# Patient Record
Sex: Female | Born: 1955 | Race: White | Hispanic: No | Marital: Married | Smoking: Never smoker
Health system: Southern US, Community
[De-identification: ages and names within clinical notes are randomized; demographics above are authoritative.]

## PROBLEM LIST (undated history)

## (undated) DIAGNOSIS — E785 Hyperlipidemia, unspecified: Secondary | ICD-10-CM

## (undated) DIAGNOSIS — I10 Essential (primary) hypertension: Secondary | ICD-10-CM

## (undated) HISTORY — DX: Essential (primary) hypertension: I10

## (undated) HISTORY — DX: Hyperlipidemia, unspecified: E78.5

---

## 1999-05-23 ENCOUNTER — Other Ambulatory Visit: Admission: RE | Admit: 1999-05-23 | Discharge: 1999-05-23 | Payer: Self-pay | Admitting: *Deleted

## 1999-11-20 ENCOUNTER — Encounter: Admission: RE | Admit: 1999-11-20 | Discharge: 1999-11-20 | Payer: Self-pay | Admitting: Family Medicine

## 1999-11-20 ENCOUNTER — Encounter: Payer: Self-pay | Admitting: Family Medicine

## 2002-02-25 ENCOUNTER — Other Ambulatory Visit: Admission: RE | Admit: 2002-02-25 | Discharge: 2002-02-25 | Payer: Self-pay | Admitting: *Deleted

## 2002-03-03 ENCOUNTER — Encounter: Admission: RE | Admit: 2002-03-03 | Discharge: 2002-03-03 | Payer: Self-pay | Admitting: Family Medicine

## 2002-03-03 ENCOUNTER — Encounter: Payer: Self-pay | Admitting: Family Medicine

## 2003-05-20 ENCOUNTER — Encounter: Admission: RE | Admit: 2003-05-20 | Discharge: 2003-05-20 | Payer: Self-pay | Admitting: Family Medicine

## 2003-05-20 ENCOUNTER — Encounter: Payer: Self-pay | Admitting: Family Medicine

## 2004-11-01 ENCOUNTER — Encounter: Admission: RE | Admit: 2004-11-01 | Discharge: 2004-11-01 | Payer: Self-pay | Admitting: Obstetrics and Gynecology

## 2005-11-04 ENCOUNTER — Encounter: Admission: RE | Admit: 2005-11-04 | Discharge: 2005-11-04 | Payer: Self-pay | Admitting: Obstetrics and Gynecology

## 2006-11-05 ENCOUNTER — Encounter: Admission: RE | Admit: 2006-11-05 | Discharge: 2006-11-05 | Payer: Self-pay | Admitting: Obstetrics and Gynecology

## 2006-11-25 ENCOUNTER — Encounter: Admission: RE | Admit: 2006-11-25 | Discharge: 2006-11-25 | Payer: Self-pay | Admitting: Obstetrics and Gynecology

## 2007-03-05 ENCOUNTER — Encounter: Admission: RE | Admit: 2007-03-05 | Discharge: 2007-04-16 | Payer: Self-pay | Admitting: Family Medicine

## 2008-02-11 ENCOUNTER — Encounter: Admission: RE | Admit: 2008-02-11 | Discharge: 2008-02-11 | Payer: Self-pay | Admitting: Obstetrics and Gynecology

## 2009-03-13 ENCOUNTER — Encounter: Admission: RE | Admit: 2009-03-13 | Discharge: 2009-03-13 | Payer: Self-pay | Admitting: Obstetrics and Gynecology

## 2010-09-05 ENCOUNTER — Encounter: Admission: RE | Admit: 2010-09-05 | Discharge: 2010-09-05 | Payer: Self-pay | Admitting: Obstetrics and Gynecology

## 2010-12-09 ENCOUNTER — Encounter: Payer: Self-pay | Admitting: Obstetrics and Gynecology

## 2011-12-23 ENCOUNTER — Other Ambulatory Visit: Payer: Self-pay | Admitting: Obstetrics and Gynecology

## 2011-12-23 DIAGNOSIS — Z1231 Encounter for screening mammogram for malignant neoplasm of breast: Secondary | ICD-10-CM

## 2011-12-27 ENCOUNTER — Ambulatory Visit
Admission: RE | Admit: 2011-12-27 | Discharge: 2011-12-27 | Disposition: A | Discharge status: Home / Self Care | Payer: Managed Care, Other (non HMO) | Source: Ambulatory Visit | Attending: Obstetrics and Gynecology | Admitting: Obstetrics and Gynecology

## 2011-12-27 DIAGNOSIS — Z1231 Encounter for screening mammogram for malignant neoplasm of breast: Secondary | ICD-10-CM

## 2012-01-06 ENCOUNTER — Other Ambulatory Visit: Payer: Self-pay | Admitting: Obstetrics and Gynecology

## 2012-01-06 DIAGNOSIS — R928 Other abnormal and inconclusive findings on diagnostic imaging of breast: Secondary | ICD-10-CM

## 2012-01-13 ENCOUNTER — Ambulatory Visit
Admission: RE | Admit: 2012-01-13 | Discharge: 2012-01-13 | Disposition: A | Discharge status: Home / Self Care | Payer: Managed Care, Other (non HMO) | Source: Ambulatory Visit | Attending: Obstetrics and Gynecology | Admitting: Obstetrics and Gynecology

## 2012-01-13 DIAGNOSIS — R928 Other abnormal and inconclusive findings on diagnostic imaging of breast: Secondary | ICD-10-CM

## 2012-08-18 ENCOUNTER — Other Ambulatory Visit: Payer: Self-pay | Admitting: Obstetrics and Gynecology

## 2012-08-18 DIAGNOSIS — N6009 Solitary cyst of unspecified breast: Secondary | ICD-10-CM

## 2012-08-27 ENCOUNTER — Ambulatory Visit
Admission: RE | Admit: 2012-08-27 | Discharge: 2012-08-27 | Disposition: A | Discharge status: Home / Self Care | Payer: Managed Care, Other (non HMO) | Source: Ambulatory Visit | Attending: Obstetrics and Gynecology | Admitting: Obstetrics and Gynecology

## 2012-08-27 ENCOUNTER — Other Ambulatory Visit: Payer: Self-pay | Admitting: Obstetrics and Gynecology

## 2012-08-27 DIAGNOSIS — N6009 Solitary cyst of unspecified breast: Secondary | ICD-10-CM

## 2013-01-06 ENCOUNTER — Other Ambulatory Visit: Payer: Self-pay | Admitting: Obstetrics and Gynecology

## 2013-01-18 ENCOUNTER — Other Ambulatory Visit: Payer: Managed Care, Other (non HMO)

## 2013-01-26 ENCOUNTER — Ambulatory Visit
Admission: RE | Admit: 2013-01-26 | Discharge: 2013-01-26 | Disposition: A | Discharge status: Home / Self Care | Payer: Managed Care, Other (non HMO) | Source: Ambulatory Visit | Attending: Obstetrics and Gynecology | Admitting: Obstetrics and Gynecology

## 2013-01-26 ENCOUNTER — Other Ambulatory Visit: Payer: Self-pay | Admitting: Obstetrics and Gynecology

## 2013-01-26 DIAGNOSIS — N6002 Solitary cyst of left breast: Secondary | ICD-10-CM

## 2013-07-14 ENCOUNTER — Telehealth: Payer: Self-pay | Admitting: Nurse Practitioner

## 2013-07-15 ENCOUNTER — Telehealth: Payer: Self-pay | Admitting: Nurse Practitioner

## 2013-07-20 MED ORDER — LISINOPRIL 20 MG PO TABS: 20.0000 mg | ORAL_TABLET | Freq: Every day | ORAL | Status: DC

## 2013-07-20 MED ORDER — ATORVASTATIN CALCIUM 40 MG PO TABS: 40.0000 mg | ORAL_TABLET | Freq: Every day | ORAL | Status: DC

## 2013-07-20 NOTE — Telephone Encounter (Signed)
done

## 2013-07-21 NOTE — Telephone Encounter (Signed)
Duplicate message. Already taken care of per chart

## 2013-08-11 ENCOUNTER — Ambulatory Visit (INDEPENDENT_AMBULATORY_CARE_PROVIDER_SITE_OTHER): Payer: Managed Care, Other (non HMO) | Admitting: Nurse Practitioner

## 2013-08-11 ENCOUNTER — Encounter: Payer: Self-pay | Admitting: Nurse Practitioner

## 2013-08-11 VITALS — BP 125/80 | HR 80 | Temp 97.7°F | Ht 66.0 in | Wt 229.0 lb

## 2013-08-11 DIAGNOSIS — E785 Hyperlipidemia, unspecified: Secondary | ICD-10-CM

## 2013-08-11 DIAGNOSIS — Z23 Encounter for immunization: Secondary | ICD-10-CM

## 2013-08-11 DIAGNOSIS — I10 Essential (primary) hypertension: Secondary | ICD-10-CM | POA: Insufficient documentation

## 2013-08-11 MED ORDER — ATORVASTATIN CALCIUM 40 MG PO TABS: 40.0000 mg | ORAL_TABLET | Freq: Every day | ORAL | Status: DC

## 2013-08-11 MED ORDER — LISINOPRIL 20 MG PO TABS: 20.0000 mg | ORAL_TABLET | Freq: Every day | ORAL | Status: DC

## 2013-08-11 NOTE — Patient Instructions (Signed)

## 2013-08-11 NOTE — Progress Notes (Signed)
  Subjective:    Patient ID: Krista Duncan, female    DOB: June 15, 1956, 57 y.o.   MRN: [id number]  Hypertension This is a chronic problem. The current episode started more than 1 year ago. The problem is unchanged. The problem is controlled. Associated symptoms include chest pain (sharp pain lasting just a second or two). Pertinent negatives include no blurred vision, headaches, malaise/fatigue, neck pain, orthopnea, palpitations, peripheral edema or shortness of breath. There are no associated agents to hypertension. Risk factors for coronary artery disease include dyslipidemia, family history, obesity and post-menopausal state. Past treatments include ACE inhibitors. The current treatment provides moderate improvement. Compliance problems include diet and exercise.   Hyperlipidemia This is a chronic problem. The current episode started more than 1 year ago. The problem is controlled. Recent lipid tests were reviewed and are high. Exacerbating diseases include obesity. She has no history of diabetes or hypothyroidism. Associated symptoms include chest pain (sharp pain lasting just a second or two). Pertinent negatives include no shortness of breath. Current antihyperlipidemic treatment includes statins. The current treatment provides moderate improvement of lipids. Compliance problems include adherence to diet.  Risk factors for coronary artery disease include hypertension, obesity and family history.      Review of Systems  Constitutional: Negative.  Negative for malaise/fatigue.  HENT: Negative.  Negative for neck pain.   Eyes: Negative.  Negative for blurred vision.  Respiratory: Negative.  Negative for shortness of breath.   Cardiovascular: Positive for chest pain (sharp pain lasting just a second or two). Negative for palpitations and orthopnea.  Gastrointestinal: Negative.   Genitourinary: Negative.   Musculoskeletal: Negative.   Neurological: Negative for headaches.  All other systems  reviewed and are negative.       Objective:   Physical Exam  Constitutional: She is oriented to person, place, and time. She appears well-developed and well-nourished.  HENT:  Nose: Nose normal.  Mouth/Throat: Oropharynx is clear and moist.  Eyes: EOM are normal.  Neck: Trachea normal, normal range of motion and full passive range of motion without pain. Neck supple. No JVD present. Carotid bruit is not present. No thyromegaly present.  Cardiovascular: Normal rate, regular rhythm, normal heart sounds and intact distal pulses.  Exam reveals no gallop and no friction rub.   No murmur heard. Pulmonary/Chest: Effort normal and breath sounds normal.  Abdominal: Soft. Bowel sounds are normal. She exhibits no distension and no mass. There is no tenderness.  Musculoskeletal: Normal range of motion.  Lymphadenopathy:    She has no cervical adenopathy.  Neurological: She is alert and oriented to person, place, and time. She has normal reflexes.  Skin: Skin is warm and dry.  Psychiatric: She has a normal mood and affect. Her behavior is normal. Judgment and thought content normal.   BP 125/80  Pulse 80  Temp(Src) 97.7 F (36.5 C) (Oral)  Ht 5\' 6"  (1.676 m)  Wt 229 lb (103.874 kg)  BMI 36.98 kg/m2        Assessment & Plan:  1. Hypertension Low Na+ diet   - CMP14+EGFR - lisinopril (PRINIVIL,ZESTRIL) 20 MG tablet; Take 1 tablet (20 mg total) by mouth daily.  Dispense: 90 tablet; Refill: 1  2. Hyperlipidemia Low fat diet and exercise encouraged - NMR, lipoprofile - atorvastatin (LIPITOR) 40 MG tablet; Take 1 tablet (40 mg total) by mouth daily.  Dispense: 90 tablet; Refill: 1  Health maintenance reviewed [facility] cards  Mary-Margaret Daphine Deutscher, FNP

## 2013-08-13 LAB — NMR, LIPOPROFILE
Cholesterol: 151 mg/dL (ref <200)
HDL Cholesterol by NMR: 45 mg/dL (ref >=40)
HDL Particle Number: 36.6 umol/L (ref >=30.5)
LP-IR Score: 57 — ABNORMAL HIGH (ref <=45)
Small LDL Particle Number: 934 nmol/L — ABNORMAL HIGH (ref <=527)
Triglycerides by NMR: 102 mg/dL (ref <150)

## 2013-08-13 LAB — CMP14+EGFR
ALT: 26 IU/L (ref 0–32)
AST: 16 IU/L (ref 0–40)
Albumin/Globulin Ratio: 1.8 (ref 1.1–2.5)
Albumin: 4.3 g/dL (ref 3.5–5.5)
Alkaline Phosphatase: 126 IU/L — ABNORMAL HIGH (ref 39–117)
BUN/Creatinine Ratio: 29 — ABNORMAL HIGH (ref 9–23)
BUN: 23 mg/dL (ref 6–24)
CO2: 24 mmol/L (ref 18–29)
GFR calc Af Amer: 100 mL/min/{1.73_m2} (ref >59)
Glucose: 98 mg/dL (ref 65–99)
Potassium: 4.7 mmol/L (ref 3.5–5.2)
Sodium: 142 mmol/L (ref 134–144)
Total Bilirubin: 0.3 mg/dL (ref 0.0–1.2)

## 2013-08-16 ENCOUNTER — Other Ambulatory Visit: Payer: Self-pay | Admitting: Nurse Practitioner

## 2013-08-27 ENCOUNTER — Other Ambulatory Visit (INDEPENDENT_AMBULATORY_CARE_PROVIDER_SITE_OTHER): Payer: Managed Care, Other (non HMO)

## 2013-08-27 DIAGNOSIS — Z1212 Encounter for screening for malignant neoplasm of rectum: Secondary | ICD-10-CM

## 2013-08-27 NOTE — Progress Notes (Signed)
Pt dropped off FOBT only 

## 2013-09-06 ENCOUNTER — Telehealth: Payer: Self-pay | Admitting: Nurse Practitioner

## 2013-09-06 NOTE — Telephone Encounter (Signed)
Patient aware.

## 2013-09-06 NOTE — Telephone Encounter (Signed)
I don't have a form.

## 2013-09-07 ENCOUNTER — Telehealth: Payer: Self-pay | Admitting: Nurse Practitioner

## 2013-09-07 NOTE — Telephone Encounter (Signed)
Patient aware form  Is up front

## 2013-10-28 ENCOUNTER — Encounter: Payer: Self-pay | Admitting: Family Medicine

## 2013-10-28 ENCOUNTER — Ambulatory Visit (INDEPENDENT_AMBULATORY_CARE_PROVIDER_SITE_OTHER): Payer: Managed Care, Other (non HMO) | Admitting: Family Medicine

## 2013-10-28 VITALS — BP 143/92 | HR 120 | Temp 96.6°F | Ht 66.0 in | Wt 230.0 lb

## 2013-10-28 DIAGNOSIS — J029 Acute pharyngitis, unspecified: Secondary | ICD-10-CM

## 2013-10-28 LAB — POCT INFLUENZA A/B
Influenza A, POC: NEGATIVE
Influenza B, POC: NEGATIVE

## 2013-10-28 LAB — POCT RAPID STREP A (OFFICE): Rapid Strep A Screen: NEGATIVE

## 2013-10-28 MED ORDER — AZITHROMYCIN 250 MG PO TABS: ORAL_TABLET | ORAL | Status: DC

## 2013-10-28 MED ORDER — BENZONATATE 100 MG PO CAPS: 200.0000 mg | ORAL_CAPSULE | Freq: Three times a day (TID) | ORAL | Status: DC | PRN

## 2013-10-28 NOTE — Progress Notes (Signed)
   Subjective:    Patient ID: Krista Duncan, female    DOB: [birth year], 57 y.o.   MRN: [id number]  HPI This 57 y.o. female presents for evaluation of uri sx's for a week.   Review of Systems No chest pain, SOB, HA, dizziness, vision change, N/V, diarrhea, constipation, dysuria, urinary urgency or frequency, myalgias, arthralgias or rash.     Objective:   Physical Exam  Vital signs noted  Well developed well nourished female.  HEENT - Head atraumatic Normocephalic                Eyes - PERRLA, Conjuctiva - clear Sclera- Clear EOMI                Ears - EAC's Wnl TM's Wnl Gross Hearing WNL                Nose - Nares patent                 Throat - oropharanx wnl Respiratory - Lungs CTA bilateral Cardiac - RRR S1 and S2 without murmur GI - Abdomen soft Nontender and bowel sounds active x 4 Extremities - No edema. Neuro - Grossly intact.  Results for orders placed in visit on 10/28/13  POCT RAPID STREP A (OFFICE)      Result Value Range   Rapid Strep A Screen Negative  Negative  POCT INFLUENZA A/B      Result Value Range   Influenza A, POC Negative     Influenza B, POC Negative        Assessment & Plan:  Acute pharyngitis - Plan: POCT rapid strep A, POCT Influenza A/B, azithromycin (ZITHROMAX) 250 MG tablet, benzonatate (TESSALON PERLES) 100 MG capsule.  Push po fluids, rest, tylenol and motrin otc prn as directed for fever, arthralgias, and myalgias.  Follow up prn if sx's continue or persist.  Deatra Canter FNP

## 2013-11-22 ENCOUNTER — Encounter: Payer: Self-pay | Admitting: Nurse Practitioner

## 2013-11-22 ENCOUNTER — Ambulatory Visit (INDEPENDENT_AMBULATORY_CARE_PROVIDER_SITE_OTHER): Payer: Managed Care, Other (non HMO) | Admitting: Nurse Practitioner

## 2013-11-22 VITALS — BP 118/81 | HR 90 | Temp 97.8°F | Ht 66.0 in | Wt 229.0 lb

## 2013-11-22 DIAGNOSIS — E785 Hyperlipidemia, unspecified: Secondary | ICD-10-CM

## 2013-11-22 DIAGNOSIS — I10 Essential (primary) hypertension: Secondary | ICD-10-CM

## 2013-11-22 MED ORDER — ATORVASTATIN CALCIUM 40 MG PO TABS: 40.0000 mg | ORAL_TABLET | Freq: Every day | ORAL | Status: DC

## 2013-11-22 MED ORDER — LISINOPRIL 20 MG PO TABS: 20.0000 mg | ORAL_TABLET | Freq: Every day | ORAL | Status: DC

## 2013-11-22 NOTE — Progress Notes (Signed)
Subjective:    Patient ID: Krista Duncan, female    DOB: September 23, 1956, 58 y.o.   MRN: [id number]  Patient here today for follow up- no changes since last .   Hypertension This is a chronic problem. The current episode started more than 1 year ago. The problem is unchanged. The problem is controlled. Associated symptoms include chest pain (sharp pain lasting just a second or two). Pertinent negatives include no blurred vision, headaches, malaise/fatigue, neck pain, orthopnea, palpitations, peripheral edema or shortness of breath. There are no associated agents to hypertension. Risk factors for coronary artery disease include dyslipidemia, family history, obesity and post-menopausal state. Past treatments include ACE inhibitors. The current treatment provides moderate improvement. Compliance problems include diet and exercise.   Hyperlipidemia This is a chronic problem. The current episode started more than 1 year ago. The problem is controlled. Recent lipid tests were reviewed and are high. Exacerbating diseases include obesity. She has no history of diabetes or hypothyroidism. Associated symptoms include chest pain (sharp pain lasting just a second or two). Pertinent negatives include no shortness of breath. Current antihyperlipidemic treatment includes statins. The current treatment provides moderate improvement of lipids. Compliance problems include adherence to diet.  Risk factors for coronary artery disease include hypertension, obesity and family history.      Review of Systems  Constitutional: Negative.  Negative for malaise/fatigue.  HENT: Negative.   Eyes: Negative.  Negative for blurred vision.  Respiratory: Negative.  Negative for shortness of breath.   Cardiovascular: Positive for chest pain (sharp pain lasting just a second or two). Negative for palpitations and orthopnea.  Gastrointestinal: Negative.   Genitourinary: Negative.   Musculoskeletal: Negative.  Negative for neck pain.   Neurological: Negative for headaches.  All other systems reviewed and are negative.       Objective:   Physical Exam  Constitutional: She is oriented to person, place, and time. She appears well-developed and well-nourished.  HENT:  Nose: Nose normal.  Mouth/Throat: Oropharynx is clear and moist.  Eyes: EOM are normal.  Neck: Trachea normal, normal range of motion and full passive range of motion without pain. Neck supple. No JVD present. Carotid bruit is not present. No thyromegaly present.  Cardiovascular: Normal rate, regular rhythm, normal heart sounds and intact distal pulses.  Exam reveals no gallop and no friction rub.   No murmur heard. Pulmonary/Chest: Effort normal and breath sounds normal.  Abdominal: Soft. Bowel sounds are normal. She exhibits no distension and no mass. There is no tenderness.  Musculoskeletal: Normal range of motion.  Lymphadenopathy:    She has no cervical adenopathy.  Neurological: She is alert and oriented to person, place, and time. She has normal reflexes.  Skin: Skin is warm and dry.  Psychiatric: She has a normal mood and affect. Her behavior is normal. Judgment and thought content normal.   BP 118/81  Pulse 90  Temp(Src) 97.8 F (36.6 C) (Oral)  Ht $R'5\' 6"'WI$  (1.676 m)  Wt 229 lb (103.874 kg)  BMI 36.98 kg/m2        Assessment & Plan:   1. Hyperlipidemia   2. Hypertension    Orders Placed This Encounter  Procedures  . CMP14+EGFR  . NMR, lipoprofile   Meds ordered this encounter  Medications  . atorvastatin (LIPITOR) 40 MG tablet    Sig: Take 1 tablet (40 mg total) by mouth daily.    Dispense:  90 tablet    Refill:  1    Order Specific Question:  Supervising Provider    Answer:  Chipper Herb [[id number]]  . lisinopril (PRINIVIL,ZESTRIL) 20 MG tablet    Sig: Take 1 tablet (20 mg total) by mouth daily.    Dispense:  90 tablet    Refill:  1    Order Specific Question:  Supervising Provider    Answer:  Chipper Herb [[id number]]    Discussed Qsymia and contrave- patient will let me know if wants to try Continue all meds Labs pending Diet and exercise encouraged Health maintenance reviewed Follow up in 3 months  [place], FNP

## 2013-11-22 NOTE — Patient Instructions (Signed)
Calorie Counting Diet A calorie counting diet requires you to eat the number of calories that are right for you in a day. Calories are the measurement of how much energy you get from the food you eat. Eating the right amount of calories is important for staying at a healthy weight. If you eat too many calories, your body will store them as fat and you may gain weight. If you eat too few calories, you may lose weight. Counting the number of calories you eat during a day will help you know if you are eating the right amount. A Registered Dietitian can determine how many calories you need in a day. The amount of calories needed varies from person to person. If your goal is to lose weight, you will need to eat fewer calories. Losing weight can benefit you if you are overweight or have health problems such as heart disease, high blood pressure, or diabetes. If your goal is to gain weight, you will need to eat more calories. Gaining weight may be necessary if you have a certain health problem that causes your body to need more energy. TIPS Whether you are increasing or decreasing the number of calories you eat during a day, it may be hard to get used to changes in what you eat and drink. The following are tips to help you keep track of the number of calories you eat.  Measure foods at home with measuring cups. This helps you know the amount of food and number of calories you are eating.  Restaurants often serve food in amounts that are larger than 1 serving. While eating out, estimate how many servings of a food you are given. For example, a serving of cooked rice is  cup or about the size of half of a fist. Knowing serving sizes will help you be aware of how much food you are eating at restaurants.  Ask for smaller portion sizes or child-size portions at restaurants.  Plan to eat half of a meal at a restaurant. Take the rest home or share the other half with a friend.  Read the Nutrition Facts panel on  food labels for calorie content and serving size. You can find out how many servings are in a package, the size of a serving, and the number of calories each serving has.  For example, a package might contain 3 cookies. The Nutrition Facts panel on that package says that 1 serving is 1 cookie. Below that, it will say there are 3 servings in the container. The calories section of the Nutrition Facts label says there are 90 calories. This means there are 90 calories in 1 cookie (1 serving). If you eat 1 cookie you have eaten 90 calories. If you eat all 3 cookies, you have eaten 270 calories (3 servings x 90 calories = 270 calories). The list below tells you how big or small some common portion sizes are.  1 oz.........4 stacked dice.  3 oz.........Deck of cards.  1 tsp........Tip of little finger.  1 tbs........Thumb.  2 tbs........Golf ball.   cup.......Half of a fist.  1 cup........A fist. KEEP A FOOD LOG Write down every food item you eat, the amount you eat, and the number of calories in each food you eat during the day. At the end of the day, you can add up the total number of calories you have eaten. It may help to keep a list like the one below. Find out the calorie information by reading the   Nutrition Facts panel on food labels. Breakfast  Bran cereal (1 cup, 110 calories).  Fat-free milk ( cup, 45 calories). Snack  Apple (1 medium, 80 calories). Lunch  Spinach (1 cup, 20 calories).  Tomato ( medium, 20 calories).  Chicken breast strips (3 oz, 165 calories).  Shredded cheddar cheese ( cup, 110 calories).  Light Italian dressing (2 tbs, 60 calories).  Whole-wheat bread (1 slice, 80 calories).  Tub margarine (1 tsp, 35 calories).  Vegetable soup (1 cup, 160 calories). Dinner  Pork chop (3 oz, 190 calories).  Brown rice (1 cup, 215 calories).  Steamed broccoli ( cup, 20 calories).  Strawberries (1  cup, 65 calories).  Whipped cream (1 tbs, 50  calories). Daily Calorie Total: 1425 Document Released: 11/04/2005 Document Revised: 01/27/2012 Document Reviewed: 05/01/2007 Exit[employer] Patient Information 2014 [employer].  

## 2013-11-23 LAB — CMP14+EGFR
A/G RATIO: 1.7 (ref 1.1–2.5)
ALBUMIN: 4.4 g/dL (ref 3.5–5.5)
ALK PHOS: 142 IU/L — AB (ref 39–117)
ALT: 36 IU/L — AB (ref 0–32)
AST: 23 IU/L (ref 0–40)
BUN / CREAT RATIO: 25 — AB (ref 9–23)
BUN: 21 mg/dL (ref 6–24)
CALCIUM: 9.4 mg/dL (ref 8.7–10.2)
CHLORIDE: 101 mmol/L (ref 97–108)
CO2: 20 mmol/L (ref 18–29)
CREATININE: 0.84 mg/dL (ref 0.57–1.00)
GFR calc non Af Amer: 78 mL/min/{1.73_m2} (ref >59)
GFR, EST AFRICAN AMERICAN: 90 mL/min/{1.73_m2} (ref >59)
GLOBULIN, TOTAL: 2.6 g/dL (ref 1.5–4.5)
Glucose: 109 mg/dL — ABNORMAL HIGH (ref 65–99)
POTASSIUM: 3.9 mmol/L (ref 3.5–5.2)
Sodium: 140 mmol/L (ref 134–144)
Total Bilirubin: 0.4 mg/dL (ref 0.0–1.2)
Total Protein: 7 g/dL (ref 6.0–8.5)

## 2013-11-23 LAB — NMR, LIPOPROFILE
CHOLESTEROL: 159 mg/dL (ref <200)
HDL CHOLESTEROL BY NMR: 51 mg/dL (ref >=40)
HDL PARTICLE NUMBER: 40.9 umol/L (ref >=30.5)
LDL PARTICLE NUMBER: 1115 nmol/L — AB (ref <1000)
LDL SIZE: 20.4 nm — AB (ref >20.5)
LDLC SERPL CALC-MCNC: 68 mg/dL (ref <100)
LP-IR Score: 79 — ABNORMAL HIGH (ref <=45)
Small LDL Particle Number: 745 nmol/L — ABNORMAL HIGH (ref <=527)
TRIGLYCERIDES BY NMR: 202 mg/dL — AB (ref <150)

## 2013-12-01 ENCOUNTER — Telehealth: Payer: Self-pay | Admitting: *Deleted

## 2013-12-01 NOTE — Telephone Encounter (Signed)
Pt aware of lab results.  appt made with MMM for 04/20/14

## 2013-12-01 NOTE — Telephone Encounter (Signed)
Message copied by Priscille Heidelberg on Wed Dec 01, 2013 10:48 AM ------      Message from: Chevis Pretty      Created: Tue Nov 23, 2013  6:09 PM       alll labs look ok- trig slightly elevatred- decrease sugars in diet\recheck all labs in 6 months ------

## 2014-01-06 ENCOUNTER — Encounter: Payer: Self-pay | Admitting: Nurse Practitioner

## 2014-01-06 ENCOUNTER — Ambulatory Visit (INDEPENDENT_AMBULATORY_CARE_PROVIDER_SITE_OTHER): Payer: Managed Care, Other (non HMO)

## 2014-01-06 ENCOUNTER — Ambulatory Visit (INDEPENDENT_AMBULATORY_CARE_PROVIDER_SITE_OTHER): Payer: Managed Care, Other (non HMO) | Admitting: Nurse Practitioner

## 2014-01-06 VITALS — BP 117/73 | HR 80 | Temp 98.2°F | Ht 66.0 in | Wt 228.0 lb

## 2014-01-06 DIAGNOSIS — S52609A Unspecified fracture of lower end of unspecified ulna, initial encounter for closed fracture: Secondary | ICD-10-CM

## 2014-01-06 DIAGNOSIS — W19XXXA Unspecified fall, initial encounter: Secondary | ICD-10-CM

## 2014-01-06 DIAGNOSIS — M25549 Pain in joints of unspecified hand: Secondary | ICD-10-CM

## 2014-01-06 NOTE — Patient Instructions (Signed)
Wrist Fracture A wrist fracture is a break in one of the bones of the wrist. Your wrist is made up of several small bones at the palm of your hand (carpal bones) and the two bones that make up your forearm (radius and ulna). The bones come together to form multiple large and small joints. The shape and design of these joints allow your wrist to bend and straighten, move side-to-side, and rotate, as in twisting your palm up or down. CAUSES  A fracture may occur in any of the bones in your wrist when enough force is applied to the wrist, such as when falling down onto an outstretched hand. Severe injuries may occur from a more forceful injury. SYMPTOMS Symptoms of wrist fractures include tenderness, bruising, and swelling. Additionally, the wrist may hang in an odd position or may be misshaped. DIAGNOSIS To diagnose a wrist fracture, your caregiver will physically examine your wrist. Your caregiver may also request an X-ray exam of your wrist. TREATMENT Treatment depends on many factors, including the nature and location of the fracture, your age, and your activity level. Treatment for wrist fracture can be nonsurgical or surgical. For nonsurgical treatment, a plaster cast or splint may be applied to your wrist if the bone is in a good position (aligned). The cast will stay on for about 6 weeks. If the alignment of your bone is not good, it may be necessary to realign (reduce) it. After the bone is reduced, a splint usually is placed on your wrist to allow for a small amount of normal swelling. After about 1 week, the splint is removed and a cast is added. The cast is removed 2 or 3 weeks later, after the swelling goes down, causing the cast to loosen. Another cast is applied. This cast is removed after about another 2 or 3 weeks, for a total of 4 to 6 weeks of immobilization. Sometimes the position of the bone is so far out of place that surgery is required to apply a device to hold it together as it  heals. If the bone cannot be reduced without cutting the skin around the bone (closed reduction), a cut (incision) is made to allow direct access to the bone to reduce it (open reduction). Depending on the fracture, there are a number of options for holding the bone in place while it heals, including a cast, metal pins, a plate and screws, and a device called an external fixator. With an external fixator, most of the hardware remains outside of the body. HOME CARE INSTRUCTIONS  To lessen swelling, keep your injured wrist elevated and move your fingers as much as possible.  Apply ice to your wrist for the first 1 to 2 days after you have been treated or as directed by your caregiver. Applying ice helps to reduce inflammation and pain.  Put ice in a plastic bag.  Place a towel between your skin and the bag.  Leave the ice on for 15 to 20 minutes at a time, every 2 hours while you are awake.  Do not put pressure on any part of your cast or splint. It may break.  Use a plastic bag to protect your cast or splint from water while bathing or showering. Do not lower your cast or splint into water.  Only take over-the-counter or prescription medicines for pain as directed by your caregiver. SEEK IMMEDIATE MEDICAL CARE IF:   Your cast or splint gets damaged or breaks.  You have continued severe pain   or more swelling than you did before the cast was put on.  Your skin or fingernails below the injury turn blue or gray or feel cold or numb.  You develop decreased feeling in your fingers. MAKE SURE YOU:  Understand these instructions.  Will watch your condition.  Will get help right away if you are not doing well or get worse. Document Released: 08/14/2005 Document Revised: 01/27/2012 Document Reviewed: 11/22/2011 [employer] Patient Information 2014 [employer].  

## 2014-01-06 NOTE — Progress Notes (Signed)
   Subjective:    Patient ID: Krista Duncan, female    DOB: 04/29/1956, 58 y.o.   MRN: [id number]  HPI Patient presents today complaining of a fall that happened last night as she was getting out of the shower. Associated symptoms include numbness and tingling to fingers and impaired range of motion. Treatment includes ice and Advil with some relief.   Review of Systems  All other systems reviewed and are negative.       Objective:   Physical Exam  Constitutional: She appears well-developed and well-nourished.  Cardiovascular: Normal rate, regular rhythm and normal heart sounds.   Pulmonary/Chest: Effort normal and breath sounds normal.  Musculoskeletal:  Decreased ROM of right wrist D/T pain with any movement- pain on palpation ulna side of right wrist.   BP 117/73  Pulse 80  Temp(Src) 98.2 F (36.8 C) (Oral)  Ht 5\' 6"  (1.676 m)  Wt 228 lb (103.42 kg)  BMI 36.82 kg/m2  Nondisplaced fracture proximal aspect of ulnar styloid.-Preliminary reading by Ronnald Collum, FNP  [facility]        Assessment & Plan:   1. Fall   2. Ulna distal fracture    Wrist splint- only remoive to take shower Ice if helps Motrin or tylenol OTC as needed recheck in 3 weeks  [place], FNP

## 2014-01-27 ENCOUNTER — Other Ambulatory Visit: Payer: Self-pay

## 2014-01-27 DIAGNOSIS — Z1231 Encounter for screening mammogram for malignant neoplasm of breast: Secondary | ICD-10-CM

## 2014-01-28 ENCOUNTER — Ambulatory Visit (INDEPENDENT_AMBULATORY_CARE_PROVIDER_SITE_OTHER): Payer: Managed Care, Other (non HMO)

## 2014-01-28 ENCOUNTER — Ambulatory Visit (INDEPENDENT_AMBULATORY_CARE_PROVIDER_SITE_OTHER): Payer: Managed Care, Other (non HMO) | Admitting: Nurse Practitioner

## 2014-01-28 ENCOUNTER — Encounter: Payer: Self-pay | Admitting: Nurse Practitioner

## 2014-01-28 VITALS — BP 127/77 | HR 87 | Temp 97.1°F | Ht 66.0 in | Wt 228.0 lb

## 2014-01-28 DIAGNOSIS — S62109A Fracture of unspecified carpal bone, unspecified wrist, initial encounter for closed fracture: Secondary | ICD-10-CM

## 2014-01-28 DIAGNOSIS — S5290XD Unspecified fracture of unspecified forearm, subsequent encounter for closed fracture with routine healing: Secondary | ICD-10-CM

## 2014-01-28 DIAGNOSIS — S52046D Nondisplaced fracture of coronoid process of unspecified ulna, subsequent encounter for closed fracture with routine healing: Secondary | ICD-10-CM

## 2014-01-28 NOTE — Progress Notes (Signed)
   Subjective:    Patient ID: Krista Duncan, female    DOB: [birth year], 58 y.o.   MRN: [id number]  HPI Patient was seen February 19 - she had fallen out of shower and landed on right wrist- she had a non dispalce ulna fracture and was put in a velcro splint- SH eia here today for recheck. Doing well- Wrist only hurts occasionally- wearing brace mos tof the time.    Review of Systems  All other systems reviewed and are negative.       Objective:   Physical Exam  Constitutional: She is oriented to person, place, and time. She appears well-developed and well-nourished.  Cardiovascular: Normal rate, regular rhythm and normal heart sounds.   Pulmonary/Chest: Effort normal and breath sounds normal.  Musculoskeletal:  FROM of right wrist without pain Grips equal bil   Neurological: She is alert and oriented to person, place, and time.  Skin: Skin is warm and dry.  Psychiatric: She has a normal mood and affect. Her behavior is normal. Judgment and thought content normal.   BP 127/77  Pulse 87  Temp(Src) 97.1 F (36.2 C) (Oral)  Ht 5\' 6"  (1.676 m)  Wt 228 lb (103.42 kg)  BMI 36.82 kg/m2 Right wrist x ray-unla fracture still visible with slight new bone growth and non displaced.Preliminary reading by Ronnald Collum, FNP  [facility]        Assessment & Plan:   1. Wrist fracture   2. Closed nondisp fx of coronoid process of ulna with routine healing    Continue to wear brace constantly ony removing to take a shower rto in 3 weeks recheck  [place], FNP

## 2014-02-04 NOTE — Addendum Note (Signed)
Addended by: Chevis Pretty on: 02/04/2014 09:40 AM   Modules accepted: Level of Service

## 2014-02-11 ENCOUNTER — Ambulatory Visit
Admission: RE | Admit: 2014-02-11 | Discharge: 2014-02-11 | Disposition: A | Discharge status: Home / Self Care | Payer: Managed Care, Other (non HMO) | Source: Ambulatory Visit

## 2014-02-11 DIAGNOSIS — Z1231 Encounter for screening mammogram for malignant neoplasm of breast: Secondary | ICD-10-CM

## 2014-02-21 ENCOUNTER — Ambulatory Visit (INDEPENDENT_AMBULATORY_CARE_PROVIDER_SITE_OTHER): Payer: Managed Care, Other (non HMO) | Admitting: Nurse Practitioner

## 2014-02-21 ENCOUNTER — Encounter: Payer: Self-pay | Admitting: Nurse Practitioner

## 2014-02-21 VITALS — BP 128/78 | HR 84 | Temp 96.8°F | Ht 66.0 in | Wt 228.0 lb

## 2014-02-21 DIAGNOSIS — S5290XD Unspecified fracture of unspecified forearm, subsequent encounter for closed fracture with routine healing: Secondary | ICD-10-CM

## 2014-02-21 DIAGNOSIS — S62109A Fracture of unspecified carpal bone, unspecified wrist, initial encounter for closed fracture: Secondary | ICD-10-CM

## 2014-02-21 DIAGNOSIS — S52244D Nondisplaced spiral fracture of shaft of ulna, right arm, subsequent encounter for closed fracture with routine healing: Secondary | ICD-10-CM

## 2014-02-21 NOTE — Progress Notes (Signed)
   Subjective:    Patient ID: Krista Duncan, female    DOB: [birth year], 59 y.o.   MRN: [id number]  HPI Patient was seen 01/06/14 with a small fracture of right ulna- was seen again on 01/28/14 for recheck-healing fracture at base of ulna- here today for last check.    Review of Systems  Constitutional: Negative.   HENT: Negative.   Respiratory: Negative.   Cardiovascular: Negative.   Genitourinary: Negative.   Psychiatric/Behavioral: Negative.   All other systems reviewed and are negative.       Objective:   Physical Exam  Constitutional: She appears well-developed and well-nourished.  Cardiovascular: Normal rate, regular rhythm and normal heart sounds.   Pulmonary/Chest: Effort normal and breath sounds normal.  Musculoskeletal:  FROM of right wrist without pain- no swelling  Skin: Skin is warm.   BP 128/78  Pulse 84  Temp(Src) 96.8 F (36 C) (Oral)  Ht 5\' 6"  (1.676 m)  Wt 228 lb (103.42 kg)  BMI 36.82 kg/m2        Assessment & Plan:   1. Closed nondisplaced spiral fracture of shaft of right ulna with routine healing    Ok to remove brace- wear prn as needed RTO prn  Mary-Margaret Hassell Done, FNP

## 2014-04-12 ENCOUNTER — Telehealth: Payer: Self-pay | Admitting: Nurse Practitioner

## 2014-04-13 NOTE — Telephone Encounter (Signed)
Patient aware. Has appt June 3rd

## 2014-04-13 NOTE — Telephone Encounter (Signed)
ntbs

## 2014-04-20 ENCOUNTER — Ambulatory Visit: Payer: Managed Care, Other (non HMO) | Admitting: Nurse Practitioner

## 2014-05-02 ENCOUNTER — Encounter: Payer: Self-pay | Admitting: *Deleted

## 2014-05-02 ENCOUNTER — Encounter: Payer: Self-pay | Admitting: Family Medicine

## 2014-05-02 ENCOUNTER — Telehealth: Payer: Self-pay | Admitting: Nurse Practitioner

## 2014-05-02 ENCOUNTER — Ambulatory Visit (INDEPENDENT_AMBULATORY_CARE_PROVIDER_SITE_OTHER): Payer: Managed Care, Other (non HMO) | Admitting: Family Medicine

## 2014-05-02 VITALS — BP 116/74 | HR 111 | Temp 97.1°F | Ht 66.0 in | Wt 225.0 lb

## 2014-05-02 DIAGNOSIS — J329 Chronic sinusitis, unspecified: Secondary | ICD-10-CM

## 2014-05-02 DIAGNOSIS — J029 Acute pharyngitis, unspecified: Secondary | ICD-10-CM

## 2014-05-02 LAB — POCT RAPID STREP A (OFFICE): RAPID STREP A SCREEN: NEGATIVE

## 2014-05-02 MED ORDER — AMOXICILLIN 500 MG PO CAPS: 500.0000 mg | ORAL_CAPSULE | Freq: Three times a day (TID) | ORAL | Status: DC

## 2014-05-02 NOTE — Telephone Encounter (Signed)
Pt has complaints of sore throat, congestion appt scheduled

## 2014-05-02 NOTE — Patient Instructions (Signed)
Take antibiotic as directed Use saline nose spray or the Neddie pot for sinus irrigation Take Claritin 1 daily over-the-counter

## 2014-05-02 NOTE — Progress Notes (Signed)
Subjective:    Patient ID: Krista Duncan, female    DOB: [birth year], 58 y.o.   MRN: [id number]  HPI Patient here tonight for sore throat, cough, and congestion x 2 days. She has also had some dizziness and headaches.       Patient Active Problem List   Diagnosis Date Noted  . Hypertension 08/11/2013  . Hyperlipidemia 08/11/2013   Outpatient Encounter Prescriptions as of 05/02/2014  Medication Sig  . atorvastatin (LIPITOR) 40 MG tablet Take 1 tablet (40 mg total) by mouth daily.  Marland Kitchen lisinopril (PRINIVIL,ZESTRIL) 20 MG tablet Take 1 tablet (20 mg total) by mouth daily.    Review of Systems  Constitutional: Negative.  Fever: possible- has not been checked.  HENT: Positive for congestion, sinus pressure and sore throat.   Eyes: Negative.   Cardiovascular: Negative.   Gastrointestinal: Negative.   Endocrine: Negative.   Genitourinary: Negative.   Musculoskeletal: Negative.   Skin: Negative.   Allergic/Immunologic: Negative.   Neurological: Positive for dizziness and headaches.  Hematological: Negative.   Psychiatric/Behavioral: Negative.        Objective:   Physical Exam  Nursing note and vitals reviewed. Constitutional: She is oriented to person, place, and time. She appears well-developed and well-nourished. No distress.  HENT:  Head: Normocephalic and atraumatic.  Right Ear: External ear normal.  Left Ear: External ear normal.  The patient has nasal congestion turbinate swelling bilaterally. The throat was red posteriorly. There was a small cervical node in the left posterior cervical chain that was tender. There were no anterior cervical nodes. There is frontal and ethmoid sinus tenderness to palpation  Eyes: Conjunctivae and EOM are normal. Pupils are equal, round, and reactive to light. Right eye exhibits no discharge. Left eye exhibits no discharge. No scleral icterus.  Neck: Normal range of motion. Neck supple. No thyromegaly present.  Cardiovascular: Regular  rhythm, normal heart sounds and intact distal pulses.  Exam reveals no friction rub.   No murmur heard. At 108 per minute  Pulmonary/Chest: Effort normal and breath sounds normal. No respiratory distress. She has no wheezes. She has no rales. She exhibits no tenderness.  Abdominal: Soft. Bowel sounds are normal.  Musculoskeletal: Normal range of motion. She exhibits no edema.  Lymphadenopathy:    She has cervical adenopathy (left posterior cervical adenopathy).  Neurological: She is alert and oriented to person, place, and time.  Skin: Skin is warm and dry. No rash noted.  Psychiatric: She has a normal mood and affect. Her behavior is normal. Judgment and thought content normal.   BP 116/74  Pulse 111  Temp(Src) 97.1 F (36.2 C) (Oral)  Ht 5\' 6"  (1.676 m)  Wt 225 lb (102.059 kg)  BMI 36.33 kg/m2  Results for orders placed in visit on 05/02/14  POCT RAPID STREP A (OFFICE)      Result Value Ref Range   Rapid Strep A Screen Negative  Negative   The patient was made aware of the rapid strep test before she left the office       Assessment & Plan:  1. Sore throat - POCT rapid strep A - Strep A culture, throat - amoxicillin (AMOXIL) 500 MG capsule; Take 1 capsule (500 mg total) by mouth 3 (three) times daily.  Dispense: 30 capsule; Refill: 0  2. Rhinosinusitis - amoxicillin (AMOXIL) 500 MG capsule; Take 1 capsule (500 mg total) by mouth 3 (three) times daily.  Dispense: 30 capsule; Refill: 0  Patient Instructions  Take antibiotic as  directed Use saline nose spray or the Neddie pot for sinus irrigation Take Claritin 1 daily over-the-counter    Arrie Senate MD

## 2014-05-05 DIAGNOSIS — Z0289 Encounter for other administrative examinations: Secondary | ICD-10-CM

## 2014-05-05 LAB — STREP A CULTURE, THROAT: STREP A CULTURE: NEGATIVE

## 2014-05-06 ENCOUNTER — Telehealth: Payer: Self-pay | Admitting: Family Medicine

## 2014-05-06 NOTE — Telephone Encounter (Signed)
Pt aware of lab results 

## 2014-05-06 NOTE — Telephone Encounter (Signed)
Message copied by Cline Crock on Fri May 06, 2014 11:38 AM ------      Message from: Chipper Herb      Created: Thu May 05, 2014  1:40 PM       The streptococcal throat culture was negative. If Patient was started on an antibiotic she should complete this full course. ------

## 2014-05-18 ENCOUNTER — Ambulatory Visit (INDEPENDENT_AMBULATORY_CARE_PROVIDER_SITE_OTHER): Payer: Managed Care, Other (non HMO) | Admitting: Nurse Practitioner

## 2014-05-18 ENCOUNTER — Encounter: Payer: Self-pay | Admitting: Nurse Practitioner

## 2014-05-18 VITALS — BP 114/76 | HR 90 | Temp 98.2°F | Ht 66.0 in | Wt 228.2 lb

## 2014-05-18 DIAGNOSIS — I10 Essential (primary) hypertension: Secondary | ICD-10-CM

## 2014-05-18 DIAGNOSIS — E785 Hyperlipidemia, unspecified: Secondary | ICD-10-CM

## 2014-05-18 MED ORDER — LISINOPRIL 20 MG PO TABS: 20.0000 mg | ORAL_TABLET | Freq: Every day | ORAL | Status: DC

## 2014-05-18 MED ORDER — ATORVASTATIN CALCIUM 40 MG PO TABS: 40.0000 mg | ORAL_TABLET | Freq: Every day | ORAL | Status: AC

## 2014-05-18 NOTE — Patient Instructions (Signed)

## 2014-05-18 NOTE — Progress Notes (Signed)
Subjective:    Patient ID: Krista Duncan, female    DOB: [birth year], 58 y.o.   MRN: [id number]  Patient here today for follow up- no changes since last .   Hyperlipidemia This is a chronic problem. The current episode started more than 1 year ago. The problem is controlled. Recent lipid tests were reviewed and are high. Exacerbating diseases include obesity. She has no history of diabetes or hypothyroidism. Associated symptoms include chest pain (sharp pain lasting just a second or two). Pertinent negatives include no shortness of breath. Current antihyperlipidemic treatment includes statins. The current treatment provides moderate improvement of lipids. Compliance problems include adherence to diet.  Risk factors for coronary artery disease include hypertension, obesity and family history.  Hypertension This is a chronic problem. The current episode started more than 1 year ago. The problem is unchanged. The problem is controlled. Associated symptoms include chest pain (sharp pain lasting just a second or two). Pertinent negatives include no blurred vision, headaches, malaise/fatigue, neck pain, orthopnea, palpitations, peripheral edema or shortness of breath. There are no associated agents to hypertension. Risk factors for coronary artery disease include dyslipidemia, family history, obesity and post-menopausal state. Past treatments include ACE inhibitors. The current treatment provides moderate improvement. Compliance problems include diet and exercise.       Review of Systems  Constitutional: Negative.  Negative for malaise/fatigue.  HENT: Negative.   Eyes: Negative.  Negative for blurred vision.  Respiratory: Negative.  Negative for shortness of breath.   Cardiovascular: Positive for chest pain (sharp pain lasting just a second or two). Negative for palpitations and orthopnea.  Gastrointestinal: Negative.   Genitourinary: Negative.   Musculoskeletal: Negative.  Negative for neck pain.   Neurological: Negative for headaches.  All other systems reviewed and are negative.      Objective:   Physical Exam  Constitutional: She is oriented to person, place, and time. She appears well-developed and well-nourished.  HENT:  Nose: Nose normal.  Mouth/Throat: Oropharynx is clear and moist.  Eyes: EOM are normal.  Neck: Trachea normal, normal range of motion and full passive range of motion without pain. Neck supple. No JVD present. Carotid bruit is not present. No thyromegaly present.  Cardiovascular: Normal rate, regular rhythm, normal heart sounds and intact distal pulses.  Exam reveals no gallop and no friction rub.   No murmur heard. Pulmonary/Chest: Effort normal and breath sounds normal.  Abdominal: Soft. Bowel sounds are normal. She exhibits no distension and no mass. There is no tenderness.  Musculoskeletal: Normal range of motion.  Lymphadenopathy:    She has no cervical adenopathy.  Neurological: She is alert and oriented to person, place, and time. She has normal reflexes.  Skin: Skin is warm and dry.  Psychiatric: She has a normal mood and affect. Her behavior is normal. Judgment and thought content normal.   BP 114/76  Pulse 90  Temp(Src) 98.2 F (36.8 C) (Oral)  Ht $R'5\' 6"'yu$  (1.676 m)  Wt 228 lb 3.2 oz (103.511 kg)  BMI 36.85 kg/m2        Assessment & Plan:   1. Hyperlipidemia   2. Essential hypertension    Orders Placed This Encounter  Procedures  . CMP14+EGFR  . NMR, lipoprofile   Meds ordered this encounter  Medications  . lisinopril (PRINIVIL,ZESTRIL) 20 MG tablet    Sig: Take 1 tablet (20 mg total) by mouth daily.    Dispense:  90 tablet    Refill:  1    Order Specific  Question:  Supervising Provider    Answer:  Chipper Herb [[id number]]  . atorvastatin (LIPITOR) 40 MG tablet    Sig: Take 1 tablet (40 mg total) by mouth daily.    Dispense:  90 tablet    Refill:  1    Order Specific Question:  Supervising Provider    Answer:  Chipper Herb [[id number]]    Labs pending Health maintenance reviewed Diet and exercise encouraged Continue all meds Follow up  In 3 months   [place]kee Hill, FNP

## 2014-05-19 ENCOUNTER — Telehealth: Payer: Self-pay | Admitting: Family Medicine

## 2014-05-19 LAB — NMR, LIPOPROFILE
Cholesterol: 150 mg/dL (ref 100–199)
HDL Cholesterol by NMR: 43 mg/dL (ref >39)
HDL Particle Number: 33.9 umol/L (ref >=30.5)
LDL PARTICLE NUMBER: 1020 nmol/L — AB (ref <1000)
LDL Size: 20.5 nm (ref >20.5)
LDLC SERPL CALC-MCNC: 85 mg/dL (ref 0–99)
LP-IR SCORE: 67 — AB (ref <=45)
Small LDL Particle Number: 505 nmol/L (ref <=527)
Triglycerides by NMR: 109 mg/dL (ref 0–149)

## 2014-05-19 LAB — CMP14+EGFR
ALK PHOS: 123 IU/L — AB (ref 39–117)
ALT: 35 IU/L — ABNORMAL HIGH (ref 0–32)
AST: 23 IU/L (ref 0–40)
Albumin/Globulin Ratio: 1.7 (ref 1.1–2.5)
Albumin: 4.3 g/dL (ref 3.5–5.5)
BILIRUBIN TOTAL: 0.5 mg/dL (ref 0.0–1.2)
BUN/Creatinine Ratio: 24 — ABNORMAL HIGH (ref 9–23)
BUN: 21 mg/dL (ref 6–24)
CO2: 22 mmol/L (ref 18–29)
Calcium: 9.5 mg/dL (ref 8.7–10.2)
Chloride: 100 mmol/L (ref 97–108)
Creatinine, Ser: 0.87 mg/dL (ref 0.57–1.00)
GFR calc non Af Amer: 75 mL/min/{1.73_m2} (ref >59)
GFR, EST AFRICAN AMERICAN: 87 mL/min/{1.73_m2} (ref >59)
GLUCOSE: 87 mg/dL (ref 65–99)
Globulin, Total: 2.5 g/dL (ref 1.5–4.5)
POTASSIUM: 4.5 mmol/L (ref 3.5–5.2)
Sodium: 137 mmol/L (ref 134–144)
Total Protein: 6.8 g/dL (ref 6.0–8.5)

## 2014-05-19 NOTE — Telephone Encounter (Signed)
Message copied by Waverly Ferrari on Thu May 19, 2014  9:56 AM ------      Message from: Chevis Pretty      Created: Thu May 19, 2014  8:05 AM       Kidney and liver function stable      Cholesterol looks great      Continue current meds- low fat diet and exercise and recheck in 3 months       ------

## 2014-09-05 ENCOUNTER — Encounter: Payer: Self-pay | Admitting: Family Medicine

## 2014-09-05 ENCOUNTER — Ambulatory Visit (INDEPENDENT_AMBULATORY_CARE_PROVIDER_SITE_OTHER): Payer: Managed Care, Other (non HMO) | Admitting: Family Medicine

## 2014-09-05 VITALS — BP 127/74 | HR 101 | Temp 97.6°F | Ht 66.0 in | Wt 228.0 lb

## 2014-09-05 DIAGNOSIS — R52 Pain, unspecified: Secondary | ICD-10-CM

## 2014-09-05 DIAGNOSIS — J029 Acute pharyngitis, unspecified: Secondary | ICD-10-CM

## 2014-09-05 DIAGNOSIS — J208 Acute bronchitis due to other specified organisms: Secondary | ICD-10-CM

## 2014-09-05 LAB — POCT INFLUENZA A/B
Influenza A, POC: NEGATIVE
Influenza B, POC: NEGATIVE

## 2014-09-05 LAB — POCT RAPID STREP A (OFFICE): Rapid Strep A Screen: NEGATIVE

## 2014-09-05 MED ORDER — METHYLPREDNISOLONE ACETATE 80 MG/ML IJ SUSP
80.0000 mg | Freq: Once | INTRAMUSCULAR | Status: AC
  Administered 2014-09-05: 80 mg via INTRAMUSCULAR

## 2014-09-05 MED ORDER — AMOXICILLIN 875 MG PO TABS: 875.0000 mg | ORAL_TABLET | Freq: Two times a day (BID) | ORAL | Status: AC

## 2014-09-05 MED ORDER — HYDROCODONE-HOMATROPINE 5-1.5 MG/5ML PO SYRP: 5.0000 mL | ORAL_SOLUTION | Freq: Three times a day (TID) | ORAL | Status: AC | PRN

## 2014-09-05 NOTE — Progress Notes (Signed)
   Subjective:    Patient ID: Krista Duncan, female    DOB: 08-27-1956, 58 y.o.   MRN: [id number]  HPI This 58 y.o. female presents for evaluation of cough, congestion, and uri sx's for a week.   Review of Systems No chest pain, SOB, HA, dizziness, vision change, N/V, diarrhea, constipation, dysuria, urinary urgency or frequency, myalgias, arthralgias or rash.     Objective:   Physical Exam Vital signs noted  Well developed well nourished female.  HEENT - Head atraumatic Normocephalic                Eyes - PERRLA, Conjuctiva - clear Sclera- Clear EOMI                Ears - EAC's Wnl TM's Wnl Gross Hearing WNL                Nose - Nares patent                 Throat - oropharanx wnl Respiratory - Lungs CTA bilateral Cardiac - RRR S1 and S2 without murmur GI - Abdomen soft Nontender and bowel sounds active x 4 Extremities - No edema. Neuro - Grossly intact.   Results for orders placed in visit on 09/05/14  POCT INFLUENZA A/B      Result Value Ref Range   Influenza A, POC Negative     Influenza B, POC Negative    POCT RAPID STREP A (OFFICE)      Result Value Ref Range   Rapid Strep A Screen Negative  Negative      Assessment & Plan:  Sore throat - Plan: POCT rapid strep A, HYDROcodone-homatropine (HYCODAN) 5-1.5 MG/5ML syrup, methylPREDNISolone acetate (DEPO-MEDROL) injection 80 mg, amoxicillin (AMOXIL) 875 MG tablet  Body aches - Plan: POCT Influenza A/B, HYDROcodone-homatropine (HYCODAN) 5-1.5 MG/5ML syrup, methylPREDNISolone acetate (DEPO-MEDROL) injection 80 mg, amoxicillin (AMOXIL) 875 MG tablet  Acute bronchitis due to other specified organisms - Plan: HYDROcodone-homatropine (HYCODAN) 5-1.5 MG/5ML syrup, methylPREDNISolone acetate (DEPO-MEDROL) injection 80 mg, amoxicillin (AMOXIL) 875 MG tablet  Lysbeth Penner FNP

## 2014-10-31 ENCOUNTER — Telehealth: Payer: Self-pay | Admitting: Nurse Practitioner

## 2014-10-31 DIAGNOSIS — I10 Essential (primary) hypertension: Secondary | ICD-10-CM

## 2014-11-01 MED ORDER — LISINOPRIL 20 MG PO TABS: 20.0000 mg | ORAL_TABLET | Freq: Every day | ORAL | Status: DC

## 2014-11-01 NOTE — Telephone Encounter (Signed)
done

## 2015-03-13 ENCOUNTER — Other Ambulatory Visit: Payer: Self-pay | Admitting: Family Medicine

## 2015-03-30 ENCOUNTER — Other Ambulatory Visit: Payer: Self-pay

## 2015-03-30 DIAGNOSIS — Z1231 Encounter for screening mammogram for malignant neoplasm of breast: Secondary | ICD-10-CM

## 2015-04-26 ENCOUNTER — Ambulatory Visit
Admission: RE | Admit: 2015-04-26 | Discharge: 2015-04-26 | Disposition: A | Discharge status: Home / Self Care | Payer: Managed Care, Other (non HMO) | Source: Ambulatory Visit

## 2015-04-26 DIAGNOSIS — Z1231 Encounter for screening mammogram for malignant neoplasm of breast: Secondary | ICD-10-CM

## 2015-07-21 ENCOUNTER — Other Ambulatory Visit: Payer: Self-pay | Admitting: Nurse Practitioner

## 2015-07-21 NOTE — Telephone Encounter (Signed)
no more refills without being seen  

## 2015-07-21 NOTE — Telephone Encounter (Signed)
Last check up was 05/2014

## 2015-11-06 ENCOUNTER — Other Ambulatory Visit: Payer: Self-pay | Admitting: Nurse Practitioner

## 2015-11-07 NOTE — Telephone Encounter (Signed)
Detailed message left for patient that rx is ready to be picked up 

## 2015-11-07 NOTE — Telephone Encounter (Signed)
Last refill without being seen 

## 2015-11-07 NOTE — Telephone Encounter (Signed)
Last seen 05/2014 

## 2016-05-01 ENCOUNTER — Other Ambulatory Visit: Payer: Self-pay | Admitting: Physician Assistant

## 2016-05-01 DIAGNOSIS — Z1231 Encounter for screening mammogram for malignant neoplasm of breast: Secondary | ICD-10-CM

## 2016-05-16 ENCOUNTER — Ambulatory Visit
Admission: RE | Admit: 2016-05-16 | Discharge: 2016-05-16 | Disposition: A | Discharge status: Home / Self Care | Payer: Managed Care, Other (non HMO) | Source: Ambulatory Visit | Attending: Physician Assistant | Admitting: Physician Assistant

## 2016-05-16 DIAGNOSIS — Z1231 Encounter for screening mammogram for malignant neoplasm of breast: Secondary | ICD-10-CM

## 2016-12-03 ENCOUNTER — Encounter (HOSPITAL_BASED_OUTPATIENT_CLINIC_OR_DEPARTMENT_OTHER): Payer: Self-pay | Admitting: *Deleted

## 2016-12-03 ENCOUNTER — Emergency Department (HOSPITAL_BASED_OUTPATIENT_CLINIC_OR_DEPARTMENT_OTHER): Payer: BLUE CROSS/BLUE SHIELD

## 2016-12-03 ENCOUNTER — Emergency Department (HOSPITAL_BASED_OUTPATIENT_CLINIC_OR_DEPARTMENT_OTHER)
Admission: EM | Admit: 2016-12-03 | Discharge: 2016-12-03 | Disposition: A | Discharge status: Home / Self Care | Payer: BLUE CROSS/BLUE SHIELD | Attending: Emergency Medicine | Admitting: Emergency Medicine

## 2016-12-03 DIAGNOSIS — Z79899 Other long term (current) drug therapy: Secondary | ICD-10-CM | POA: Diagnosis not present

## 2016-12-03 DIAGNOSIS — Y92410 Unspecified street and highway as the place of occurrence of the external cause: Secondary | ICD-10-CM | POA: Insufficient documentation

## 2016-12-03 DIAGNOSIS — Y939 Activity, unspecified: Secondary | ICD-10-CM | POA: Diagnosis not present

## 2016-12-03 DIAGNOSIS — S161XXA Strain of muscle, fascia and tendon at neck level, initial encounter: Secondary | ICD-10-CM

## 2016-12-03 DIAGNOSIS — I10 Essential (primary) hypertension: Secondary | ICD-10-CM | POA: Insufficient documentation

## 2016-12-03 DIAGNOSIS — Y999 Unspecified external cause status: Secondary | ICD-10-CM | POA: Diagnosis not present

## 2016-12-03 DIAGNOSIS — S199XXA Unspecified injury of neck, initial encounter: Secondary | ICD-10-CM | POA: Diagnosis present

## 2016-12-03 MED ORDER — METHOCARBAMOL 500 MG PO TABS: 500.0000 mg | ORAL_TABLET | Freq: Two times a day (BID) | ORAL | 0 refills | Status: AC

## 2016-12-03 MED ORDER — IBUPROFEN 600 MG PO TABS: 600.0000 mg | ORAL_TABLET | Freq: Four times a day (QID) | ORAL | 0 refills | Status: AC | PRN

## 2016-12-03 MED FILL — METHOCARBAMOL 500 MG TABLET: 500 | 10 days supply | Qty: 20 | Fill #0

## 2016-12-03 MED FILL — IBUPROFEN 600 MG TABLET: 600 | 7 days supply | Qty: 30 | Fill #0
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
## 2017-08-05 ENCOUNTER — Other Ambulatory Visit: Payer: Self-pay | Admitting: Physician Assistant

## 2017-08-05 DIAGNOSIS — Z1231 Encounter for screening mammogram for malignant neoplasm of breast: Secondary | ICD-10-CM

## 2017-08-15 ENCOUNTER — Ambulatory Visit
Admission: RE | Admit: 2017-08-15 | Discharge: 2017-08-15 | Disposition: A | Discharge status: Home / Self Care | Payer: BLUE CROSS/BLUE SHIELD | Source: Ambulatory Visit | Attending: Physician Assistant | Admitting: Physician Assistant

## 2017-08-15 DIAGNOSIS — Z1231 Encounter for screening mammogram for malignant neoplasm of breast: Secondary | ICD-10-CM

## 2018-08-24 ENCOUNTER — Other Ambulatory Visit: Payer: Self-pay | Admitting: Physician Assistant

## 2018-08-24 DIAGNOSIS — Z1231 Encounter for screening mammogram for malignant neoplasm of breast: Secondary | ICD-10-CM

## 2018-10-01 ENCOUNTER — Ambulatory Visit
Admission: RE | Admit: 2018-10-01 | Discharge: 2018-10-01 | Disposition: A | Discharge status: Home / Self Care | Payer: BLUE CROSS/BLUE SHIELD | Source: Ambulatory Visit | Attending: Physician Assistant | Admitting: Physician Assistant

## 2018-10-01 DIAGNOSIS — Z1231 Encounter for screening mammogram for malignant neoplasm of breast: Secondary | ICD-10-CM

## 2018-10-06 ENCOUNTER — Other Ambulatory Visit: Payer: Self-pay | Admitting: Physician Assistant

## 2018-10-06 DIAGNOSIS — R928 Other abnormal and inconclusive findings on diagnostic imaging of breast: Secondary | ICD-10-CM

## 2018-10-13 ENCOUNTER — Ambulatory Visit
Admission: RE | Admit: 2018-10-13 | Discharge: 2018-10-13 | Disposition: A | Discharge status: Home / Self Care | Payer: BLUE CROSS/BLUE SHIELD | Source: Ambulatory Visit | Attending: Physician Assistant | Admitting: Physician Assistant

## 2018-10-13 ENCOUNTER — Other Ambulatory Visit: Payer: Self-pay | Admitting: Physician Assistant

## 2018-10-13 DIAGNOSIS — R928 Other abnormal and inconclusive findings on diagnostic imaging of breast: Secondary | ICD-10-CM

## 2018-10-13 DIAGNOSIS — N6001 Solitary cyst of right breast: Secondary | ICD-10-CM

## 2019-02-17 DIAGNOSIS — C801 Malignant (primary) neoplasm, unspecified: Secondary | ICD-10-CM

## 2019-02-17 HISTORY — DX: Malignant (primary) neoplasm, unspecified: C80.1

## 2019-04-14 ENCOUNTER — Other Ambulatory Visit: Payer: BLUE CROSS/BLUE SHIELD

## 2019-04-20 ENCOUNTER — Other Ambulatory Visit: Payer: BLUE CROSS/BLUE SHIELD

## 2019-05-17 ENCOUNTER — Other Ambulatory Visit: Payer: Self-pay | Admitting: Physician Assistant

## 2019-05-17 ENCOUNTER — Ambulatory Visit
Admission: RE | Admit: 2019-05-17 | Discharge: 2019-05-17 | Disposition: A | Discharge status: Home / Self Care | Payer: BLUE CROSS/BLUE SHIELD | Source: Ambulatory Visit | Attending: Physician Assistant | Admitting: Physician Assistant

## 2019-05-17 ENCOUNTER — Other Ambulatory Visit: Payer: Self-pay

## 2019-05-17 DIAGNOSIS — N6001 Solitary cyst of right breast: Secondary | ICD-10-CM

## 2019-11-17 ENCOUNTER — Ambulatory Visit
Admission: RE | Admit: 2019-11-17 | Discharge: 2019-11-17 | Disposition: A | Discharge status: Home / Self Care | Payer: BC Managed Care – PPO | Source: Ambulatory Visit | Attending: Physician Assistant | Admitting: Physician Assistant

## 2019-11-17 ENCOUNTER — Ambulatory Visit: Payer: BC Managed Care – PPO

## 2019-11-17 ENCOUNTER — Other Ambulatory Visit: Payer: Self-pay

## 2019-11-17 DIAGNOSIS — N6001 Solitary cyst of right breast: Secondary | ICD-10-CM

## 2020-10-17 ENCOUNTER — Other Ambulatory Visit: Payer: Self-pay | Admitting: Family Medicine

## 2020-10-17 DIAGNOSIS — Z872 Personal history of diseases of the skin and subcutaneous tissue: Secondary | ICD-10-CM

## 2020-11-23 ENCOUNTER — Other Ambulatory Visit: Payer: Self-pay

## 2020-11-23 ENCOUNTER — Ambulatory Visit
Admission: RE | Admit: 2020-11-23 | Discharge: 2020-11-23 | Disposition: A | Discharge status: Home / Self Care | Payer: BC Managed Care – PPO | Source: Ambulatory Visit | Attending: Family Medicine | Admitting: Family Medicine

## 2020-11-23 DIAGNOSIS — Z872 Personal history of diseases of the skin and subcutaneous tissue: Secondary | ICD-10-CM

## 2021-10-22 ENCOUNTER — Other Ambulatory Visit: Payer: Self-pay | Admitting: Family Medicine

## 2021-10-22 DIAGNOSIS — Z1231 Encounter for screening mammogram for malignant neoplasm of breast: Secondary | ICD-10-CM

## 2021-11-26 ENCOUNTER — Ambulatory Visit
Admission: RE | Admit: 2021-11-26 | Discharge: 2021-11-26 | Disposition: A | Discharge status: Home / Self Care | Payer: BC Managed Care – PPO | Source: Ambulatory Visit | Attending: Family Medicine | Admitting: Family Medicine

## 2021-11-26 DIAGNOSIS — Z1231 Encounter for screening mammogram for malignant neoplasm of breast: Secondary | ICD-10-CM

## 2022-02-25 ENCOUNTER — Ambulatory Visit: Payer: BC Managed Care – PPO | Admitting: Physical Therapy

## 2022-05-07 ENCOUNTER — Encounter: Payer: Self-pay | Admitting: Physical Therapy

## 2022-05-07 ENCOUNTER — Ambulatory Visit: Payer: BC Managed Care – PPO | Attending: Physician Assistant | Admitting: Physical Therapy

## 2022-05-07 DIAGNOSIS — R293 Abnormal posture: Secondary | ICD-10-CM | POA: Diagnosis present

## 2022-05-07 DIAGNOSIS — M5459 Other low back pain: Secondary | ICD-10-CM | POA: Insufficient documentation

## 2022-05-07 NOTE — Therapy (Signed)
[facility] [place], [place], [address] Phone: [phone]   Fax:  [phone]  Physical Therapy Evaluation  Patient Details  Name: Krista Duncan MRN: [id number] Date of Birth: [birth year] Referring Provider (PT): [facility] PA-C.   Encounter Date: 05/07/2022   PT End of Session - 05/07/22 1213     Visit Number 1    Number of Visits 12    Date for PT Re-Evaluation 06/18/22    Authorization Type FOTO.    PT Start Time [id number]    PT Stop Time 0945    PT Time Calculation (min) 50 min    Activity Tolerance Patient tolerated treatment well    Behavior During Therapy [facility] for tasks assessed/performed             Past Medical History:  Diagnosis Date   Cancer ([place]) 02/2019   Uterine   Hyperlipidemia    Hypertension     History reviewed. No pertinent surgical history.  There were no vitals filed for this visit.    Subjective Assessment - 05/07/22 0921     Subjective The patient presents to the clinic today with a CC of low back pain that she states has been ongoing since last summer.  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  She can now only do about 5 to 10 minutes of standing acivities before she need to sit down.  She is able to shop but walks with a shopping cart.  She describes her pain as aching over her entire lumbar region.  As experiences occasional shooting pain as well.    Pertinent History HTN, CA (resolved), osteopenia, neuropathy ("hands and feet").    How long can you sit comfortably? Unlimited.    How long can you stand comfortably? 5-10 minutes.    How long can you walk comfortably? Increased pain with walking.  Use shopping cart for UE support.    Patient Stated Goals To be able to do more with less pain.    Currently in Pain? Yes    Pain Score 6     Pain Location Back    Pain Orientation Right;Left    Pain Descriptors / Indicators Aching;Shooting    Pain Type Chronic  pain    Pain Onset More than a month ago    Pain Frequency Intermittent    Aggravating Factors  Standing, walking and working.    Pain Relieving Factors Rest.                [facility] PT Assessment - 05/07/22 0001       Assessment   Medical Diagnosis DDD, Lumbar.    Referring Provider (PT) Eulis Canner PA-C.    Onset Date/Surgical Date --   One year+.     Precautions   Precautions None      Balance Screen   Has the patient fallen in the past 6 months No    Has the patient had a decrease in activity level because of a fear of falling?  Yes    Is the patient reluctant to leave their home because of a fear of falling?  No      Home Environment   Living Environment Private residence      Prior Function   Level of Independence Independent      Observation/Other Assessments   Focus on Therapeutic Outcomes (FOTO)  Complete.      Posture/Postural Control   Posture/Postural Control Postural limitations  Postural Limitations Rounded Shoulders;Forward head;Increased thoracic kyphosis;Posterior pelvic tilt;Decreased lumbar lordosis      Deep Tendon Reflexes   DTR Assessment Site Patella;Achilles    Patella DTR 2+    Achilles DTR 0      ROM / Strength   AROM / PROM / Strength AROM;Strength      AROM   Overall AROM Comments Active lumbar flexion limited by 50% and extension to 10 degrees.      Strength   Overall Strength Comments Normal bilateral LE strength is normal.      Palpation   Palpation comment Diffuse bilateral lumbar muscle tenderness reported.      Special Tests   Other special tests Equal leg lengths. (-) SLR and FABER testing.      Ambulation/Gait   Gait Comments WNL.                        Objective measurements completed on examination: See above findings.       [facility] Adult PT Treatment/Exercise - 05/07/22 0001       Modalities   Modalities Electrical Stimulation;Moist Heat      Moist Heat Therapy   Number Minutes Moist Heat  20 Minutes    Moist Heat Location Lumbar Spine      Electrical Stimulation   Electrical Stimulation Location Bilateral lumbar region.    Electrical Stimulation Action IFC at 80-150 Hz.    Electrical Stimulation Parameters 40% scan x 20 minutes.    Electrical Stimulation Goals Pain;Tone                          PT Long Term Goals - 05/07/22 1224       PT LONG TERM GOAL #1   Title Independent with a HEP.    Time 6    Period Weeks    Status New      PT LONG TERM GOAL #2   Title Stand 20 minutes with pain not > 3/10.    Time 6    Period Weeks    Status New      PT LONG TERM GOAL #3   Title Perform ADL's with pain not > 3-4/10.    Time 6    Period Weeks    Status New                    Plan - 05/07/22 1215     Clinical Impression Statement The patient presents to the OPP[facility] with c/o chronic low back pain.  She has diffuse bilateral lumbar muscualture tenderness.  She has limited active lumbar flexion and extension and multiple postural abnormalities.  Her LE strength is normal.  Pain prohibits her from performing various ADL's especially standing activities.  Patient will benefit from skilled physical therapy intervention to address pain and deficits.    Personal Factors and Comorbidities Comorbidity 1;Other    Comorbidities HTN, CA (resolved), osteopenia, neuropathy ("hands and feet").    Examination-Activity Limitations Other;Locomotion Level    Examination-Participation Restrictions Other;Meal Prep;Yard Work    Stability/Clinical Decision Making Stable/Uncomplicated    Clinical Decision Making Low    Rehab Potential Good    PT Frequency 2x / week    PT Duration 6 weeks    PT Treatment/Interventions ADLs/Self Care Home Management;Cryotherapy;Electrical Stimulation;Ultrasound;Moist Heat;Functional mobility training;Therapeutic activities;Therapeutic exercise;Patient/family education;Manual techniques;Passive range of motion;Dry needling    PT Next  Visit Plan Combo e'stim/US, STW/M, core exercise progression.  Consulted and Agree with Plan of Care Patient             Patient will benefit from skilled therapeutic intervention in order to improve the following deficits and impairments:  Decreased activity tolerance, Decreased range of motion, Increased muscle spasms, Postural dysfunction, Pain  Visit Diagnosis: Other low back pain  Abnormal posture     Problem List Patient Active Problem List   Diagnosis Date Noted   Hypertension 08/11/2013   Hyperlipidemia 08/11/2013   Rationale for Evaluation and Treatment Rehabilitation.  Milaya Hora, [place], PT 05/07/2022, 12:26 PM  [facility] [address] [place], [place], [address] Phone: [phone]   Fax:  [phone]  Name: Krista Duncan MRN: [id number] Date of Birth: 11-Oct-1956

## 2022-05-13 ENCOUNTER — Ambulatory Visit: Payer: BC Managed Care – PPO | Admitting: Physical Therapy

## 2022-05-13 DIAGNOSIS — R293 Abnormal posture: Secondary | ICD-10-CM

## 2022-05-13 DIAGNOSIS — M5459 Other low back pain: Secondary | ICD-10-CM | POA: Diagnosis not present

## 2022-05-16 ENCOUNTER — Ambulatory Visit: Payer: BC Managed Care – PPO | Admitting: *Deleted

## 2022-05-16 DIAGNOSIS — M5459 Other low back pain: Secondary | ICD-10-CM

## 2022-05-16 DIAGNOSIS — R293 Abnormal posture: Secondary | ICD-10-CM

## 2022-05-16 NOTE — Therapy (Signed)
[facility] [place], [place], [address] Phone: [phone]   Fax:  [phone]  Physical Therapy Treatment  Patient Details  Name: Krista Duncan MRN: [id number] Date of Birth: 01/19/1956 Referring Provider (PT): [facility] PA-C.   Encounter Date: 05/16/2022   PT End of Session - 05/16/22 0953     Visit Number 3    Number of Visits 12    Date for PT Re-Evaluation 06/18/22    Authorization Type FOTO.    PT Start Time 0900    PT Stop Time 0951    PT Time Calculation (min) 51 min             Past Medical History:  Diagnosis Date   Cancer ([place]) 02/2019   Uterine   Hyperlipidemia    Hypertension     No past surgical history on file.  There were no vitals filed for this visit.   Subjective Assessment - 05/16/22 0900     Subjective Pain about a 2/10    Pertinent History HTN, CA (resolved), osteopenia, neuropathy ("hands and feet").    How long can you sit comfortably? Unlimited.    How long can you stand comfortably? 5-10 minutes.    How long can you walk comfortably? Increased pain with walking.  Use shopping cart for UE support.    Patient Stated Goals To be able to do more with less pain.    Currently in Pain? Yes    Pain Score 2     Pain Location Back    Pain Orientation Right;Left    Pain Descriptors / Indicators Aching    Pain Type Chronic pain    Pain Onset More than a month ago                               [facility] Adult PT Treatment/Exercise - 05/16/22 0001       Self-Care   Self-Care ADL's;Posture;Other Self-Care Comments    Other Self-Care Comments  Discussed walking and keeping pain under a 5/10.Discussed and reviewed ADLs and finding neutral pelvis position and AB bracing with neutral position  with ADL's practiced log roll in/out  of bed.      Exercises   Exercises Lumbar      Lumbar Exercises: Seated   Other Seated Lumbar Exercises seated SOC 2x10 find  neutral position      Lumbar Exercises: Supine   Ab Set 10 reps;5 seconds      Modalities   Modalities Electrical Stimulation;Moist Heat;Ultrasound      Moist Heat Therapy   Number Minutes Moist Heat 15 Minutes    Moist Heat Location Lumbar Spine      Electrical Stimulation   Electrical Stimulation Location Bilateral lumbar.    Electrical Stimulation Action IFC x 15 mins    Electrical Stimulation Parameters 80-'150hz'$     Electrical Stimulation Goals Pain;Tone                          PT Long Term Goals - 05/07/22 1224       PT LONG TERM GOAL #1   Title Independent with a HEP.    Time 6    Period Weeks    Status New      PT LONG TERM GOAL #2   Title Stand 20 minutes with pain not > 3/10.    Time 6    Period  Weeks    Status New      PT LONG TERM GOAL #3   Title Perform ADL's with pain not > 3-4/10.    Time 6    Period Weeks    Status New                   Plan - 05/16/22 1003     Clinical Impression Statement Pt arrived today with minimal LBP. Rx focused on core activation and finding neytral pelvis with SOC as well as reviewing ADL's  using good body mechanics. Handout given for AB bracing  and SOC. IFC end of session.    Personal Factors and Comorbidities Comorbidity 1;Other    Comorbidities HTN, CA (resolved), osteopenia, neuropathy ("hands and feet").    Examination-Activity Limitations Other;Locomotion Level    Examination-Participation Restrictions Other;Meal Prep;Yard Work    Stability/Clinical Decision Making Stable/Uncomplicated    Rehab Potential Good    PT Frequency 2x / week    PT Duration 6 weeks    PT Treatment/Interventions ADLs/Self Care Home Management;Cryotherapy;Electrical Stimulation;Ultrasound;Moist Heat;Functional mobility training;Therapeutic activities;Therapeutic exercise;Patient/family education;Manual techniques;Passive range of motion;Dry needling    PT Next Visit Plan Combo e'stim/US, STW/M, core exercise  progression.    Consulted and Agree with Plan of Care Patient             Patient will benefit from skilled therapeutic intervention in order to improve the following deficits and impairments:  Decreased activity tolerance, Decreased range of motion, Increased muscle spasms, Postural dysfunction, Pain  Visit Diagnosis: Other low back pain  Abnormal posture     Problem List Patient Active Problem List   Diagnosis Date Noted   Hypertension 08/11/2013   Hyperlipidemia 08/11/2013  Rationale for Evaluation and Treatment Rehabilitation   Constantinos Krempasky,CHRIS, PTA 05/16/2022, 10:16 AM  [facility] [address] [place], [place], [address] Phone: [phone]   Fax:  [phone]  Name: Krista Duncan MRN: [id number] Date of Birth: 21-Jan-1956

## 2022-05-22 ENCOUNTER — Encounter: Payer: Self-pay | Admitting: *Deleted

## 2022-05-22 ENCOUNTER — Ambulatory Visit: Payer: BC Managed Care – PPO | Attending: Physician Assistant | Admitting: *Deleted

## 2022-05-22 DIAGNOSIS — M5459 Other low back pain: Secondary | ICD-10-CM | POA: Diagnosis not present

## 2022-05-22 DIAGNOSIS — R293 Abnormal posture: Secondary | ICD-10-CM | POA: Diagnosis present

## 2022-05-22 NOTE — Therapy (Signed)
[facility] OUTPATIENT PHYSICAL THERAPY TREATMENT NOTE   Patient Name: Krista Duncan MRN: [id number] DOB:1955-12-04, 66 y.o., female Today's Date: 05/22/2022   REFERRING PROVIDER:   Eulis Canner PA-C.     PT End of Session - 05/22/22 0812     Visit Number 4    Number of Visits 12    Date for PT Re-Evaluation 06/18/22    Authorization Type FOTO.    PT Start Time 0815    PT Stop Time 0905    PT Time Calculation (min) 50 min             Past Medical History:  Diagnosis Date   Cancer ([place]) 02/2019   Uterine   Hyperlipidemia    Hypertension    History reviewed. No pertinent surgical history. Patient Active Problem List   Diagnosis Date Noted   Hypertension 08/11/2013   Hyperlipidemia 08/11/2013    REFERRING DIAG: DDD, Lumbar.   THERAPY DIAG:  Other low back pain  Abnormal posture  Rationale for Evaluation and Treatment Rehabilitation  PERTINENT HISTORY: HTN, CA (resolved), osteopenia, neuropathy ("hands and feet").   PRECAUTIONS: NONE  SUBJECTIVE: Did okay after last. Pain today 4-5/10  PAIN:  Are you having pain? Yes: NPRS scale: 4/10 Pain location: LB Pain description: shooting and aching Aggravating factors: sitting and standing, working Relieving factors: Rest     TODAY'S TREATMENT:                                     EXERCISE LOG  Exercise Repetitions and Resistance Comments  SOC  3x10  find neutral pos.   AB set 2x10                Blank cell = exercise not performed today  Modalities  Date:  Unattended Estim: Lumbar, IFC 80-150, x15 mins, Pain Combo: Lumbar, 1.5w/cm2, 10 mins, Pain Hot Pack: Lumbar, 15 mins, Pain                HOME EXERCISE PROGRAM: Reviewed HEP     PT Long Term Goals - 05/22/22 0813       PT LONG TERM GOAL #1   Title Independent with a HEP.    Time 6    Period Weeks    Status New      PT LONG TERM GOAL #2   Title Stand 20 minutes with pain not > 3/10.    Time 6    Period Weeks    Status  New      PT LONG TERM GOAL #3   Title Perform ADL's with pain not > 3-4/10.    Time 6    Period Weeks    Status New              Plan - 05/22/22 0900     Clinical Impression Statement Pt arrived today doing some better with LBP. HEP was reviewed for [facility] and AB bracing as well as body mechanics for ADL's.Tactile and verbal cuing needed for HEP. [place] combo and IFC for pain control did well.   Personal Factors and Comorbidities Comorbidity 1;Other    Comorbidities HTN, CA (resolved), osteopenia, neuropathy ("hands and feet").    Examination-Activity Limitations Other;Locomotion Level    Examination-Participation Restrictions Other;Meal Prep;Yard Work    Stability/Clinical Decision Making Stable/Uncomplicated    Rehab Potential Good    PT Frequency 2x / week    PT Duration 6  weeks    PT Treatment/Interventions ADLs/Self Care Home Management;Cryotherapy;Electrical Stimulation;Ultrasound;Moist Heat;Functional mobility training;Therapeutic activities;Therapeutic exercise;Patient/family education;Manual techniques;Passive range of motion;Dry needling    PT Next Visit Plan Combo e'stim/US, STW/M, core exercise progression.    Consulted and Agree with Plan of Care Patient               Krista Duncan,CHRIS, PTA 05/22/2022, 9:13 AM

## 2022-05-27 ENCOUNTER — Encounter: Payer: Self-pay | Admitting: *Deleted

## 2022-05-27 ENCOUNTER — Ambulatory Visit: Payer: BC Managed Care – PPO | Admitting: *Deleted

## 2022-05-27 DIAGNOSIS — R293 Abnormal posture: Secondary | ICD-10-CM

## 2022-05-27 DIAGNOSIS — M5459 Other low back pain: Secondary | ICD-10-CM

## 2022-05-27 NOTE — Therapy (Signed)
[facility] OUTPATIENT PHYSICAL THERAPY TREATMENT NOTE   Patient Name: Krista Duncan MRN: [id number] DOB:08/21/1956, 66 y.o., female Today's Date: 05/27/2022   REFERRING PROVIDER:   Eulis Canner PA-C.     PT End of Session - 05/27/22 0826     Visit Number 5    Number of Visits 12    Date for PT Re-Evaluation 06/18/22    Authorization Type FOTO.    PT Start Time 0815    PT Stop Time 0905    PT Time Calculation (min) 50 min             Past Medical History:  Diagnosis Date   Cancer ([place]) 02/2019   Uterine   Hyperlipidemia    Hypertension    History reviewed. No pertinent surgical history. Patient Active Problem List   Diagnosis Date Noted   Hypertension 08/11/2013   Hyperlipidemia 08/11/2013    REFERRING DIAG: DDD, Lumbar.   THERAPY DIAG:  Other low back pain  Abnormal posture  Rationale for Evaluation and Treatment Rehabilitation  PERTINENT HISTORY: HTN, CA (resolved), osteopenia, neuropathy ("hands and feet").   PRECAUTIONS: NONE  SUBJECTIVE: Did okay after last.Walked 12 mins this morning PAIN:  Are you having pain? Yes: NPRS scale: 5/10 Pain location: LB Pain description: shooting and aching Aggravating factors: sitting and standing, working Relieving factors: Rest     TODAY'S TREATMENT:                                     EXERCISE LOG  Exercise Repetitions and Resistance Comments  SOC  1x10   AB set 2x10   AB brace with leg raise X6 each side hold 5 secs   Dying bug X6 each side hold 5 secs   Bridge  X10 hold 5 secs    Blank cell = exercise not performed today  Modalities  Date:  Unattended Estim: Lumbar, IFC 80-150, x15 mins, Pain Combo: Lumbar, 1.5w/cm2, 8 mins, Pain Hot Pack: Lumbar, 15 mins, Pain                HOME EXERCISE PROGRAM: Reviewed HEP SOC. Added new exs above and HO given    PT Long Term Goals - 05/22/22 0813       PT LONG TERM GOAL #1   Title Independent with a HEP.    Time 6    Period Weeks     Status New      PT LONG TERM GOAL #2   Title Stand 20 minutes with pain not > 3/10.    Time 6    Period Weeks    Status New      PT LONG TERM GOAL #3   Title Perform ADL's with pain not > 3-4/10.    Time 6    Period Weeks    Status New              Plan - 05/22/22 0900     Clinical Impression Statement Pt arrived today doing some better with LBP. Rx focused on core progression with hooklying knee raisr, Dying bug, and bridging and Pt did well. A handout was given for HEP. [place] and IFC end of session for pain control and good results 3/10 end of Rx.   Personal Factors and Comorbidities Comorbidity 1;Other    Comorbidities HTN, CA (resolved), osteopenia, neuropathy ("hands and feet").    Examination-Activity Limitations Other;Locomotion Level    Examination-Participation  Restrictions Other;Meal Prep;Yard Work    Stability/Clinical Decision Making Stable/Uncomplicated    Rehab Potential Good    PT Frequency 2x / week    PT Duration 6 weeks    PT Treatment/Interventions ADLs/Self Care Home Management;Cryotherapy;Electrical Stimulation;Ultrasound;Moist Heat;Functional mobility training;Therapeutic activities;Therapeutic exercise;Patient/family education;Manual techniques;Passive range of motion;Dry needling    PT Next Visit Plan Combo e'stim/US, STW/M, core exercise progression.    Consulted and Agree with Plan of Care Patient               Roshawn Ayala,CHRIS, PTA 05/27/2022, 9:21 AM

## 2022-05-30 ENCOUNTER — Encounter: Payer: Self-pay | Admitting: *Deleted

## 2022-05-30 ENCOUNTER — Ambulatory Visit: Payer: BC Managed Care – PPO | Admitting: *Deleted

## 2022-05-30 DIAGNOSIS — M5459 Other low back pain: Secondary | ICD-10-CM | POA: Diagnosis not present

## 2022-05-30 DIAGNOSIS — R293 Abnormal posture: Secondary | ICD-10-CM

## 2022-05-30 NOTE — Therapy (Signed)
[facility] OUTPATIENT PHYSICAL THERAPY TREATMENT NOTE   Patient Name: Krista Duncan MRN: [id number] DOB:30-May-1956, 66 y.o., female Today's Date: 05/30/2022   REFERRING PROVIDER:   Eulis Canner PA-C.     PT End of Session - 05/30/22 0814     Visit Number 6    Number of Visits 12    Date for PT Re-Evaluation 06/18/22    Authorization Type FOTO.    PT Start Time 0815             Past Medical History:  Diagnosis Date   Cancer ([place]) 02/2019   Uterine   Hyperlipidemia    Hypertension    History reviewed. No pertinent surgical history. Patient Active Problem List   Diagnosis Date Noted   Hypertension 08/11/2013   Hyperlipidemia 08/11/2013    REFERRING DIAG: DDD, Lumbar.   THERAPY DIAG:  Other low back pain  Abnormal posture  Rationale for Evaluation and Treatment Rehabilitation  PERTINENT HISTORY: HTN, CA (resolved), osteopenia, neuropathy ("hands and feet").   PRECAUTIONS: NONE  SUBJECTIVE: Did okay after last Rx., but did a lot of standing canning green beans and my back was killing me 7-8/10. Need a mat at home toperform exs PAIN:  Are you having pain? Yes: NPRS scale: 2/10 Pain location: LB Pain description: shooting and aching Aggravating factors: sitting and standing, working Relieving factors: Rest     TODAY'S TREATMENT:                                     EXERCISE LOG                       05-30-22  Exercise Repetitions and Resistance Comments  Nustep L3 -4x 10 mins       AB set    AB brace with hooklying march X6 each side hold 5 secs   Dying bug X6 each side hold 5 secs  Cues for technique    Bridge  X10 hold 10 secs    Blank cell = exercise not performed today  Modalities  Date:  Unattended Estim: Lumbar, IFC 80-150 intensity 8, x15 mins, Pain Combo: Lumbar, 1.5w/cm2, 8 mins, Pain Hot Pack: Lumbar, 15 mins, Pain                HOME EXERCISE PROGRAM:     PT Long Term Goals - 05/22/22 0813       PT LONG TERM GOAL #1    Title Independent with a HEP.    Time 6    Period Weeks    Status Partially met     PT LONG TERM GOAL #2   Title Stand 20 minutes with pain not > 3/10.    Time 6    Period Weeks    Status Ongoing     PT LONG TERM GOAL #3   Title Perform ADL's with pain not > 3-4/10.    Time 6    Period Weeks    Status Ongoing             Plan - 05/30/22 0900     Clinical Impression Statement Pt arrived today doing fairly well with LBP 2-3/10 but reports increased pain when standing a lot the other day. She reports not getting to perform exs due to not getting a mat yet. We discussed LB exs needed to be done daily to be beneficial. LTGs are ongoing  due to high pain levels during standing and ADL's   Personal Factors and Comorbidities Comorbidity 1;Other    Comorbidities HTN, CA (resolved), osteopenia, neuropathy ("hands and feet").    Examination-Activity Limitations Other;Locomotion Level    Examination-Participation Restrictions Other;Meal Prep;Yard Work    Stability/Clinical Decision Making Stable/Uncomplicated    Rehab Potential Good    PT Frequency 2x / week    PT Duration 6 weeks    PT Treatment/Interventions ADLs/Self Care Home Management;Cryotherapy;Electrical Stimulation;Ultrasound;Moist Heat;Functional mobility training;Therapeutic activities;Therapeutic exercise;Patient/family education;Manual techniques;Passive range of motion;Dry needling    PT Next Visit Plan Combo e'stim/US, STW/M, core exercise progression.    Consulted and Agree with Plan of Care Patient               Tate Jerkins,CHRIS, PTA 05/30/2022, 9:07 AM

## 2022-06-03 ENCOUNTER — Ambulatory Visit: Payer: BC Managed Care – PPO

## 2022-06-03 DIAGNOSIS — R293 Abnormal posture: Secondary | ICD-10-CM

## 2022-06-03 DIAGNOSIS — M5459 Other low back pain: Secondary | ICD-10-CM | POA: Diagnosis not present

## 2022-06-03 NOTE — Therapy (Signed)
[facility] OUTPATIENT PHYSICAL THERAPY TREATMENT NOTE   Patient Name: Krista Duncan MRN: [id number] DOB:15-Feb-1956, 66 y.o., female Today's Date: 06/03/2022   REFERRING PROVIDER:   Iantha Fallen PA-C.     PT End of Session - 06/03/22 0818     Visit Number 7    Number of Visits 12    Date for PT Re-Evaluation 06/18/22    Authorization Type FOTO.    PT Start Time 0815    PT Stop Time 0918    PT Time Calculation (min) 63 min    Activity Tolerance Patient tolerated treatment well    Behavior During Therapy [facility] [place] for tasks assessed/performed              Past Medical History:  Diagnosis Date   Cancer (HCC) 02/2019   Uterine   Hyperlipidemia    Hypertension    History reviewed. No pertinent surgical history. Patient Active Problem List   Diagnosis Date Noted   Hypertension 08/11/2013   Hyperlipidemia 08/11/2013    REFERRING DIAG: DDD, Lumbar.   THERAPY DIAG:  Other low back pain  Abnormal posture  Rationale for Evaluation and Treatment Rehabilitation  PERTINENT HISTORY: HTN, CA (resolved), osteopenia, neuropathy ("hands and feet").   PRECAUTIONS: NONE  SUBJECTIVE: Patient reports that she feels alright this morning. She reported that she tried doing her HEP over the weekend and she felt that it may have helped her stand a little longer.  PAIN:  Are you having pain? Yes: NPRS scale: 1/10 Pain location: LB Pain description: shooting and aching Aggravating factors: sitting and standing, working Relieving factors: Rest     TODAY'S TREATMENT:                                    7/17 EXERCISE LOG  Exercise Repetitions and Resistance Comments  Nustep  L4 x 15 minutes   Bridge 15 reps w/ 5 second hold    SLR 3 x 5 reps each    Seated HS stretch  4 x 30 seconds each    Standing ball press  5 second hold x 3 minutes  For ab set  Ball roll out 2 minutes    Blank cell = exercise not performed today  Modalities: no redness or adverse reaction to today's  modalities  Unattended Estim: Lumbar, IFC, 80-150 Hz w/ 40% scan, 15 mins, Pain Hot Pack: Lumbar, 15 mins, Pain                                    EXERCISE LOG                       05-30-22  Exercise Repetitions and Resistance Comments  Nustep L3 -4x 10 mins       AB set    AB brace with hooklying march X6 each side hold 5 secs   Dying bug X6 each side hold 5 secs  Cues for technique    Bridge  X10 hold 10 secs    Blank cell = exercise not performed today  Modalities  Date:  Unattended Estim: Lumbar, IFC 80-150 intensity 8, x15 mins, Pain Combo: Lumbar, 1.5w/cm2, 8 mins, Pain Hot Pack: Lumbar, 15 mins, Pain                HOME EXERCISE PROGRAM: [id number]  PT Long Term Goals - 05/22/22 0813       PT LONG TERM GOAL #1   Title Independent with a HEP.    Time 6    Period Weeks    Status Partially met     PT LONG TERM GOAL #2   Title Stand 20 minutes with pain not > 3/10.    Time 6    Period Weeks    Status Ongoing     PT LONG TERM GOAL #3   Title Perform ADL's with pain not > 3-4/10.    Time 6    Period Weeks    Status Ongoing             Plan - 05/30/22 0900     Clinical Impression Statement Patient was introduced to straight leg raises and seated hamstring stretching for improved lumbar stability and soft tissue extensibility, respectively. She required minimal cueing with today's new interventions for proper exercise performance. She reported no significant pain or discomfort with any of today's interventions. She reported feeling a little sore upon the conclusion of treatment. She continues to require skilled physical therapy to address her remaining impairments to    Personal Factors and Comorbidities Comorbidity 1;Other    Comorbidities HTN, CA (resolved), osteopenia, neuropathy ("hands and feet").    Examination-Activity Limitations Other;Locomotion Level    Examination-Participation Restrictions Other;Meal Prep;Yard Work     Stability/Clinical Decision Making Stable/Uncomplicated    Rehab Potential Good    PT Frequency 2x / week    PT Duration 6 weeks    PT Treatment/Interventions ADLs/Self Care Home Management;Cryotherapy;Electrical Stimulation;Ultrasound;Moist Heat;Functional mobility training;Therapeutic activities;Therapeutic exercise;Patient/family education;Manual techniques;Passive range of motion;Dry needling    PT Next Visit Plan Combo e'stim/US, STW/M, core exercise progression.    Consulted and Agree with Plan of Care Patient               Darlin Coco, PT 06/03/2022, 12:29 PM

## 2022-06-06 ENCOUNTER — Ambulatory Visit: Payer: BC Managed Care – PPO | Admitting: Physical Therapy

## 2022-06-06 ENCOUNTER — Encounter: Payer: Self-pay | Admitting: Physical Therapy

## 2022-06-06 DIAGNOSIS — R293 Abnormal posture: Secondary | ICD-10-CM

## 2022-06-06 DIAGNOSIS — M5459 Other low back pain: Secondary | ICD-10-CM

## 2022-06-06 NOTE — Therapy (Signed)
[facility] OUTPATIENT PHYSICAL THERAPY TREATMENT NOTE   Patient Name: Krista Duncan MRN: [id number] DOB:[birth year], 66 y.o., female Today's Date: 06/06/2022   REFERRING PROVIDER:   Eulis Canner PA-C.     PT End of Session - 06/06/22 0811     Visit Number 8    Number of Visits 12    Date for PT Re-Evaluation 06/18/22    Authorization Type FOTO.    PT Start Time 0818    PT Stop Time 0910    PT Time Calculation (min) 52 min    Activity Tolerance Patient tolerated treatment well    Behavior During Therapy [facility] for tasks assessed/performed              Past Medical History:  Diagnosis Date   Cancer ([place]) 02/2019   Uterine   Hyperlipidemia    Hypertension    History reviewed. No pertinent surgical history. Patient Active Problem List   Diagnosis Date Noted   Hypertension 08/11/2013   Hyperlipidemia 08/11/2013    REFERRING DIAG: DDD, Lumbar.   THERAPY DIAG:  Other low back pain  Abnormal posture  Rationale for Evaluation and Treatment Rehabilitation  PERTINENT HISTORY: HTN, CA (resolved), osteopenia, neuropathy ("hands and feet").   PRECAUTIONS: NONE  SUBJECTIVE: Patient reports that she feels alright this morning.  PAIN:  Are you having pain? No     TODAY'S TREATMENT:                                    7/20 EXERCISE LOG  Exercise Repetitions and Resistance Comments  Nustep  L4 x 10 minutes   D2 in sitting Red; x15 reps   LAQ 5# RLE x15 reps with RLE but stopped d/t pain   Standing ball press  5 second hold x 3 minutes  For ab set  Ball roll out 3 minutes    Blank cell = exercise not performed today   Modalities  Date: 06/06/2022 Unattended Estim: Lumbar, 80-150 hz, 15 mins, Pain   HOME EXERCISE PROGRAM: [id number]    PT Long Term Goals - 06/06/22 1117      PT LONG TERM GOAL #1   Title Independent with a HEP.    Time 6    Period Weeks    Status Partially met     PT LONG TERM GOAL #2   Title Stand 20 minutes with pain not > 3/10.     Time 6    Period Weeks    Status Ongoing     PT LONG TERM GOAL #3   Title Perform ADL's with pain not > 3-4/10.    Time 6    Period Weeks    Status Ongoing             Plan - 06/06/22 1117    Clinical Impression Statement Patient presented in clinic with reports of pain with ADLs. Patient able to tolerate therex fairly well in standing and sitting but did not want supine exercises as the plinth table worsened LBP at last session. Normal modality response noted following removal of the modality.   Personal Factors and Comorbidities Comorbidity 1;Other    Comorbidities HTN, CA (resolved), osteopenia, neuropathy ("hands and feet").    Examination-Activity Limitations Other;Locomotion Level    Examination-Participation Restrictions Other;Meal Prep;Yard Work    Stability/Clinical Decision Making Stable/Uncomplicated    Rehab Potential Good    PT Frequency 2x / week  PT Duration 6 weeks    PT Treatment/Interventions ADLs/Self Care Home Management;Cryotherapy;Electrical Stimulation;Ultrasound;Moist Heat;Functional mobility training;Therapeutic activities;Therapeutic exercise;Patient/family education;Manual techniques;Passive range of motion;Dry needling    PT Next Visit Plan Combo e'stim/US, STW/M, core exercise progression.    Consulted and Agree with Plan of Care Patient               Standley Brooking, PTA 06/06/2022, 11:18 AM

## 2022-06-10 ENCOUNTER — Ambulatory Visit: Payer: BC Managed Care – PPO | Admitting: Physical Therapy

## 2022-06-10 ENCOUNTER — Encounter: Payer: Self-pay | Admitting: Physical Therapy

## 2022-06-10 DIAGNOSIS — R293 Abnormal posture: Secondary | ICD-10-CM

## 2022-06-10 DIAGNOSIS — M5459 Other low back pain: Secondary | ICD-10-CM | POA: Diagnosis not present

## 2022-06-10 NOTE — Therapy (Signed)
OUTPATIENT PHYSICAL THERAPY TREATMENT NOTE   Patient Name: Krista Duncan MRN: [id number] DOB:26-Nov-1955, 66 y.o., female Today's Date: 06/10/2022   REFERRING PROVIDER:   Eulis Canner PA-C.     PT End of Session - 06/10/22 0818     Visit Number 9    Number of Visits 12    Date for PT Re-Evaluation 06/18/22    Authorization Type FOTO.    PT Start Time 0817    PT Stop Time 0905    PT Time Calculation (min) 48 min    Activity Tolerance Patient tolerated treatment well    Behavior During Therapy [facility] for tasks assessed/performed              Past Medical History:  Diagnosis Date   Cancer ([place]) 02/2019   Uterine   Hyperlipidemia    Hypertension    History reviewed. No pertinent surgical history. Patient Active Problem List   Diagnosis Date Noted   Hypertension 08/11/2013   Hyperlipidemia 08/11/2013    REFERRING DIAG: DDD, Lumbar.   THERAPY DIAG:  Other low back pain  Abnormal posture  Rationale for Evaluation and Treatment Rehabilitation  PERTINENT HISTORY: HTN, CA (resolved), osteopenia, neuropathy ("hands and feet").   PRECAUTIONS: NONE  SUBJECTIVE: Patient reports that she had a lot of pain this morning when waking and showering. Did not do much yesterday.  PAIN:  Are you having pain? Yes: NPRS scale: 5/10 Pain location: lumbar Pain description: Discomfort Aggravating factors: Activity Relieving factors:         TODAY'S TREATMENT:                                    7/24 EXERCISE LOG  Exercise Repetitions and Resistance Comments  Nustep  L4 x 10 minutes   D2 in sitting AROM; x15 reps   Standing ball press  5 second hold x 3 minutes  For ab set  Ball roll out 3 minutes, side to side x2 min   Shoulder extension Green x20 reps    Blank cell = exercise not performed today   Modalities  Date: 06/10/2022 Unattended Estim: Lumbar, 80-150 hz, 15 mins, Pain   HOME EXERCISE PROGRAM: [id number]    PT Long Term Goals - 06/06/22 1117       PT LONG TERM GOAL #1   Title Independent with a HEP.    Time 6    Period Weeks    Status Partially met     PT LONG TERM GOAL #2   Title Stand 20 minutes with pain not > 3/10.    Time 6    Period Weeks    Status Ongoing     PT LONG TERM GOAL #3   Title Perform ADL's with pain not > 3-4/10.    Time 6    Period Weeks    Status Ongoing             Plan - 06/06/22 1117    Clinical Impression Statement Patient presented in clinic with moderate mid to low back pain especially this morning upon waking. Patient progressed through core stabilization and stretching. Normal modalities response noted following removal of the modalities.   Personal Factors and Comorbidities Comorbidity 1;Other    Comorbidities HTN, CA (resolved), osteopenia, neuropathy ("hands and feet").    Examination-Activity Limitations Other;Locomotion Level    Examination-Participation Restrictions Other;Meal Prep;Yard Work    Stability/Clinical Decision Making Stable/Uncomplicated  Rehab Potential Good    PT Frequency 2x / week    PT Duration 6 weeks    PT Treatment/Interventions ADLs/Self Care Home Management;Cryotherapy;Electrical Stimulation;Ultrasound;Moist Heat;Functional mobility training;Therapeutic activities;Therapeutic exercise;Patient/family education;Manual techniques;Passive range of motion;Dry needling    PT Next Visit Plan Combo e'stim/US, STW/M, core exercise progression.    Consulted and Agree with Plan of Care Patient               Standley Brooking, PTA 06/10/2022, 9:24 AM

## 2022-06-13 ENCOUNTER — Ambulatory Visit: Payer: BC Managed Care – PPO | Admitting: Physical Therapy

## 2022-06-13 ENCOUNTER — Encounter: Payer: Self-pay | Admitting: Physical Therapy

## 2022-06-13 DIAGNOSIS — M5459 Other low back pain: Secondary | ICD-10-CM | POA: Diagnosis not present

## 2022-06-13 DIAGNOSIS — R293 Abnormal posture: Secondary | ICD-10-CM

## 2022-06-13 NOTE — Therapy (Signed)
OUTPATIENT PHYSICAL THERAPY TREATMENT NOTE   Patient Name: Krista Duncan MRN: [id number] DOB:1956-06-03, 67 y.o., female Today's Date: 06/13/2022   REFERRING PROVIDER:   Eulis Canner PA-C.     PT End of Session - 06/13/22 0813     Visit Number 10    Number of Visits 12    Date for PT Re-Evaluation 06/18/22    Authorization Type FOTO.    PT Start Time 0815    PT Stop Time 0902    PT Time Calculation (min) 47 min    Activity Tolerance Patient tolerated treatment well    Behavior During Therapy [facility] for tasks assessed/performed              Past Medical History:  Diagnosis Date   Cancer ([place]) 02/2019   Uterine   Hyperlipidemia    Hypertension    History reviewed. No pertinent surgical history. Patient Active Problem List   Diagnosis Date Noted   Hypertension 08/11/2013   Hyperlipidemia 08/11/2013    REFERRING DIAG: DDD, Lumbar.   THERAPY DIAG:  Other low back pain  Abnormal posture  Rationale for Evaluation and Treatment Rehabilitation  PERTINENT HISTORY: HTN, CA (resolved), osteopenia, neuropathy ("hands and feet").   PRECAUTIONS: NONE  SUBJECTIVE: Patient reports that she had a lot of pain this morning when waking and showering. Did not do much yesterday.  PAIN:  Are you having pain? Yes: NPRS scale: 0/10 Pain location: lumbar Pain description: Discomfort Aggravating factors: Activity Relieving factors:         TODAY'S TREATMENT:                                    7/24 EXERCISE LOG  Exercise Repetitions and Resistance Comments  Nustep  L4 x 11 minutes   Standing D2 Blue XTS x20 reps   Seated marching X15 reps   Bridging X10 reps 5 sec holds   Shoulder extension Blue XTS x20 reps   Dead bug  X5 rep To ensure proper technique for HEP   Blank cell = exercise not performed today   Modalities  Date: 06/10/2022 Unattended Estim: Lumbar, 80-150 hz, 15 mins, Pain   HOME EXERCISE PROGRAM: [id number]    PT Long Term Goals - 06/06/22  1117      PT LONG TERM GOAL #1   Title Independent with a HEP.    Time 6    Period Weeks    Status Partially met     PT LONG TERM GOAL #2   Title Stand 20 minutes with pain not > 3/10.    Time 6    Period Weeks    Status Ongoing     PT LONG TERM GOAL #3   Title Perform ADL's with pain not > 3-4/10.    Time 6    Period Weeks    Status Ongoing             Plan - 06/06/22 1117    Clinical Impression Statement Patient presented in clinic with no pain but only has pain with activity. Patient able to tolerate standing activities but more pain reported with seated. Patient ensured proper technique of bridging and dead bug for HEP. Normal modalities response noted following removal of the modalities.   Personal Factors and Comorbidities Comorbidity 1;Other    Comorbidities HTN, CA (resolved), osteopenia, neuropathy ("hands and feet").    Examination-Activity Limitations Other;Locomotion Level    Examination-Participation  Restrictions Other;Meal Prep;Yard Work    Stability/Clinical Decision Making Stable/Uncomplicated    Rehab Potential Good    PT Frequency 2x / week    PT Duration 6 weeks    PT Treatment/Interventions ADLs/Self Care Home Management;Cryotherapy;Electrical Stimulation;Ultrasound;Moist Heat;Functional mobility training;Therapeutic activities;Therapeutic exercise;Patient/family education;Manual techniques;Passive range of motion;Dry needling    PT Next Visit Plan Combo e'stim/US, STW/M, core exercise progression. FOTO at 11th visit.   Consulted and Agree with Plan of Care Patient               Standley Brooking, PTA 06/13/2022, 9:48 AM

## 2022-06-17 ENCOUNTER — Encounter: Payer: Self-pay | Admitting: Physical Therapy

## 2022-06-17 ENCOUNTER — Ambulatory Visit: Payer: BC Managed Care – PPO | Admitting: Physical Therapy

## 2022-06-17 DIAGNOSIS — M5459 Other low back pain: Secondary | ICD-10-CM

## 2022-06-17 DIAGNOSIS — R293 Abnormal posture: Secondary | ICD-10-CM

## 2022-06-17 NOTE — Therapy (Addendum)
OUTPATIENT PHYSICAL THERAPY TREATMENT NOTE   Patient Name: Krista Duncan MRN: [id number] DOB:09-06-1956, 66 y.o., female Today's Date: 06/17/2022   REFERRING PROVIDER:   Eulis Canner PA-C.     PT End of Session - 06/17/22 1003     Visit Number 11    Number of Visits 12    Date for PT Re-Evaluation 06/18/22    Authorization Type FOTO.    PT Start Time 0900    PT Stop Time 0951    PT Time Calculation (min) 51 min    Activity Tolerance Patient tolerated treatment well    Behavior During Therapy [facility] for tasks assessed/performed              Past Medical History:  Diagnosis Date   Cancer ([place]) 02/2019   Uterine   Hyperlipidemia    Hypertension    History reviewed. No pertinent surgical history. Patient Active Problem List   Diagnosis Date Noted   Hypertension 08/11/2013   Hyperlipidemia 08/11/2013    REFERRING DIAG: DDD, Lumbar.   THERAPY DIAG:  Other low back pain  Abnormal posture  Rationale for Evaluation and Treatment Rehabilitation  PERTINENT HISTORY: HTN, CA (resolved), osteopenia, neuropathy ("hands and feet").   PRECAUTIONS: NONE  SUBJECTIVE: Patient reports that she had a lot of pain this morning when waking and showering. Did not do much yesterday.  PAIN:  Are you having pain? Yes: NPRS scale: 2/10 Pain location: lumbar Pain description: Discomfort Aggravating factors: Activity Relieving factors:         TODAY'S TREATMENT:                                    7/31EXERCISE LOG  Exercise Repetitions and Resistance Comments  Nustep  L4 x 13 minutes                        Blank cell = exercise not performed today  STW/M x 10 minutes to patient's affected lumbar region. Modalities  Date: 06/10/2022 Unattended Estim: Lumbar, 80-150 hz, 20 mins, Pain     PT Long Term Goals - 06/06/22 1117      PT LONG TERM GOAL #1   Title Independent with a HEP.    Time 6    Period Weeks    Status Partially met     PT LONG TERM GOAL #2    Title Stand 20 minutes with pain not > 3/10.    Time 6    Period Weeks    Status Ongoing     PT LONG TERM GOAL #3   Title Perform ADL's with pain not > 3-4/10.    Time 6    Period Weeks    Status Ongoing             Plan - 06/06/22 1117    Clinical Impression Statement Pain about a two today.  Patient states she is going to see a spine doctor.  She did well with treatment today.   Personal Factors and Comorbidities Comorbidity 1;Other    Comorbidities HTN, CA (resolved), osteopenia, neuropathy ("hands and feet").    Examination-Activity Limitations Other;Locomotion Level    Examination-Participation Restrictions Other;Meal Prep;Yard Work    Stability/Clinical Decision Making Stable/Uncomplicated    Rehab Potential Good    PT Frequency 2x / week    PT Duration 6 weeks    PT Treatment/Interventions ADLs/Self Care Home Management;Cryotherapy;Electrical Stimulation;Ultrasound;Moist  Heat;Functional mobility training;Therapeutic activities;Therapeutic exercise;Patient/family education;Manual techniques;Passive range of motion;Dry needling    PT Next Visit Plan Combo e'stim/US, STW/M, core exercise progression. FOTO at 11th visit.   Consulted and Agree with Plan of Care Patient            Progress Note Reporting Period 05/07/22 to 06/17/22.  See note below for Objective Data and Assessment of Progress/Goals. Patient with a lower pain-level today.  Goals are ongoing.      Doctor Sheahan, [place], PT 06/17/2022, 10:05 AM

## 2022-06-20 ENCOUNTER — Ambulatory Visit: Payer: BC Managed Care – PPO | Attending: Physician Assistant

## 2022-06-20 DIAGNOSIS — M5459 Other low back pain: Secondary | ICD-10-CM

## 2022-06-20 DIAGNOSIS — R293 Abnormal posture: Secondary | ICD-10-CM | POA: Diagnosis present

## 2022-06-20 NOTE — Addendum Note (Signed)
Addended by: Svea Pusch, [place] W on: 06/20/2022 11:45 AM   Modules accepted: Orders

## 2022-06-20 NOTE — Therapy (Signed)
OUTPATIENT PHYSICAL THERAPY TREATMENT NOTE   Patient Name: Krista Duncan MRN: [id number] DOB:July 09, 1956, 66 y.o., female Today's Date: 06/20/2022   REFERRING PROVIDER:   Eulis Canner PA-C.     PT End of Session - 06/20/22 0902     Visit Number 12    Number of Visits 12    Date for PT Re-Evaluation 06/18/22    Authorization Type FOTO.    PT Start Time 0900    Activity Tolerance Patient tolerated treatment well    Behavior During Therapy [facility] for tasks assessed/performed              Past Medical History:  Diagnosis Date   Cancer ([place]) 02/2019   Uterine   Hyperlipidemia    Hypertension    History reviewed. No pertinent surgical history. Patient Active Problem List   Diagnosis Date Noted   Hypertension 08/11/2013   Hyperlipidemia 08/11/2013    REFERRING DIAG: DDD, Lumbar.   THERAPY DIAG:  Other low back pain  Abnormal posture  Rationale for Evaluation and Treatment Rehabilitation  PERTINENT HISTORY: HTN, CA (resolved), osteopenia, neuropathy ("hands and feet").   PRECAUTIONS: NONE  SUBJECTIVE: Pt reports seeing her spine doctor yesterday and they are going to move forward with injections.  Pt denies pain today.   PAIN:  Are you having pain? No: NPRS scale: 0/10      TODAY'S TREATMENT:                                    8/3 EXERCISE LOG  Exercise Repetitions and Resistance Comments  Nustep  L4 x 16 minutes   LAQ 3# 20 reps; bil   Seated Marches 3# 20 reps; bil   Seated hip abduction Red tband x 2 mins   Ball Squeezes 2 mins   Ham Curl Red tband 20 reps each   Rows Red tband 20 reps   Extension Red tband 20 reps   Horizontal Abduction Red tband 20 reps    Blank cell = exercise not performed today   Modalities  Date:  Unattended Estim: Lumbar, 80-150 Hz, 15 mins, Pain and Tone Hot Pack: Lumbar, 15 mins, Pain and Tone     PT Long Term Goals - 06/06/22 1117      PT LONG TERM GOAL #1   Title Independent with a HEP.    Time 6     Period Weeks    Status Partially met     PT LONG TERM GOAL #2   Title Stand 20 minutes with pain not > 3/10.    Time 6    Period Weeks    Status Ongoing (06/20/22: can stand approx. 10 mins)     PT LONG TERM GOAL #3   Title Perform ADL's with pain not > 3-4/10.    Time 6    Period Weeks    Status Partially Met (06/20/22: cleaning can exacerbate pain)             Plan - 06/06/22 1117    Clinical Impression Statement Pt arrives for today's treatment session denying any pain.  Pt able to tolerate seated LE exercises to increase strength and function.  Pt experiences more difficulty with RLE with all exercises.  Pt introduced seated resisted shoulder exercises to assist with postural correction.  Resisted shoulder exercises added to pt's HEP.  Normal responses to estim and MH noted upon removal.  Pt denied any pain  upon completion of today's treatment session.   Personal Factors and Comorbidities Comorbidity 1;Other    Comorbidities HTN, CA (resolved), osteopenia, neuropathy ("hands and feet").    Examination-Activity Limitations Other;Locomotion Level    Examination-Participation Restrictions Other;Meal Prep;Yard Work    Stability/Clinical Decision Making Stable/Uncomplicated    Rehab Potential Good    PT Frequency 2x / week    PT Duration 6 weeks    PT Treatment/Interventions ADLs/Self Care Home Management;Cryotherapy;Electrical Stimulation;Ultrasound;Moist Heat;Functional mobility training;Therapeutic activities;Therapeutic exercise;Patient/family education;Manual techniques;Passive range of motion;Dry needling    PT Next Visit Plan Combo e'stim/US, STW/M, core exercise progression. FOTO at 11th visit.   Consulted and Agree with Plan of Care Patient                  Kathrynn Ducking, PTA 06/20/2022, 9:02 AM

## 2022-06-24 ENCOUNTER — Ambulatory Visit: Payer: BC Managed Care – PPO

## 2022-06-24 DIAGNOSIS — R293 Abnormal posture: Secondary | ICD-10-CM

## 2022-06-24 DIAGNOSIS — M5459 Other low back pain: Secondary | ICD-10-CM

## 2022-06-24 NOTE — Therapy (Signed)
OUTPATIENT PHYSICAL THERAPY TREATMENT NOTE   Patient Name: Krista Duncan MRN: [id number] DOB:02/11/1956, 66 y.o., female Today's Date: 06/24/2022   REFERRING PROVIDER:   Eulis Canner PA-C.     PT End of Session - 06/24/22 0903     Visit Number 13    Number of Visits 16    Date for PT Re-Evaluation 07/05/22    Authorization Type FOTO.    PT Start Time 0900    PT Stop Time 1000    PT Time Calculation (min) 60 min    Activity Tolerance Patient tolerated treatment well    Behavior During Therapy [facility] for tasks assessed/performed              Past Medical History:  Diagnosis Date   Cancer ([place]) 02/2019   Uterine   Hyperlipidemia    Hypertension    History reviewed. No pertinent surgical history. Patient Active Problem List   Diagnosis Date Noted   Hypertension 08/11/2013   Hyperlipidemia 08/11/2013    REFERRING DIAG: DDD, Lumbar.   THERAPY DIAG:  Other low back pain  Abnormal posture  Rationale for Evaluation and Treatment Rehabilitation  PERTINENT HISTORY: HTN, CA (resolved), osteopenia, neuropathy ("hands and feet").   PRECAUTIONS: NONE  SUBJECTIVE: Pt reports that she is feeling good this morning, but will be begin hurting about 20 mins into activity.  Pt reports soreness after last treatment session.  PAIN:  Are you having pain? No: NPRS scale: 0/10      TODAY'S TREATMENT:  8/7 EXERCISE LOG  Exercise Repetitions and Resistance Comments  Nustep  L4 x 16 minutes   LAQ 3# 20 reps; bil   Seated Marches 3# 20 reps; bil   Seated hip abduction Red tband x 2 mins   Ball Squeezes 2 mins   Ham Curl Red tband 20 reps each   Single knee to chest 5 reps x 20 sec hold Bil   Lumbar Trunk Rotation 5 reps x 20 sec hold Bil   Bridges 15 reps with 3 sec hold   SLR 10 reps Bil   Rows    Extension    Horizontal Abduction     Blank cell = exercise not performed today   Modalities  Date:  Unattended Estim: Lumbar, 80-150 Hz, 15 mins, Pain and  Tone Hot Pack: Lumbar, 15 mins, Pain and Tone                                          8/3 EXERCISE LOG  Exercise Repetitions and Resistance Comments  Nustep  L4 x 16 minutes   LAQ 3# 20 reps; bil   Seated Marches 3# 20 reps; bil   Seated hip abduction Red tband x 2 mins   Ball Squeezes 2 mins   Ham Curl Red tband 20 reps each   Rows Red tband 20 reps   Extension Red tband 20 reps   Horizontal Abduction Red tband 20 reps    Blank cell = exercise not performed today   Modalities  Date:  Unattended Estim: Lumbar, 80-150 Hz, 15 mins, Pain and Tone Hot Pack: Lumbar, 15 mins, Pain and Tone     PT Long Term Goals - 06/06/22 1117      PT LONG TERM GOAL #1   Title Independent with a HEP.    Time 6    Period Weeks    Status Partially  met     PT LONG TERM GOAL #2   Title Stand 20 minutes with pain not > 3/10.    Time 6    Period Weeks    Status Ongoing (06/20/22: can stand approx. 10 mins)     PT LONG TERM GOAL #3   Title Perform ADL's with pain not > 3-4/10.    Time 6    Period Weeks    Status Partially Met (06/20/22: cleaning can exacerbate pain)             Plan - 06/06/22 1117    Clinical Impression Statement Pt arrives for today's treatment session denying any pain.  Pt instructed in supine lumbar stretches to perform at home as needed.  Pt given printed HEP of seated exercises and stretches.  Normal responses to estim and MH noted upon removal.  Pt denied any pain at completion of today's treatment session.   Personal Factors and Comorbidities Comorbidity 1;Other    Comorbidities HTN, CA (resolved), osteopenia, neuropathy ("hands and feet").    Examination-Activity Limitations Other;Locomotion Level    Examination-Participation Restrictions Other;Meal Prep;Yard Work    Stability/Clinical Decision Making Stable/Uncomplicated    Rehab Potential Good    PT Frequency 2x / week    PT Duration 6 weeks    PT Treatment/Interventions ADLs/Self Care Home  Management;Cryotherapy;Electrical Stimulation;Ultrasound;Moist Heat;Functional mobility training;Therapeutic activities;Therapeutic exercise;Patient/family education;Manual techniques;Passive range of motion;Dry needling    PT Next Visit Plan Combo e'stim/US, STW/M, core exercise progression. FOTO at 11th visit.   Consulted and Agree with Plan of Care Patient                  Kathrynn Ducking, PTA 06/24/2022, 10:05 AM

## 2022-06-26 ENCOUNTER — Ambulatory Visit: Payer: BC Managed Care – PPO

## 2022-06-26 DIAGNOSIS — M5459 Other low back pain: Secondary | ICD-10-CM | POA: Diagnosis not present

## 2022-06-26 DIAGNOSIS — R293 Abnormal posture: Secondary | ICD-10-CM

## 2022-06-26 NOTE — Therapy (Signed)
OUTPATIENT PHYSICAL THERAPY TREATMENT NOTE   Patient Name: Krista Duncan MRN: [id number] DOB:02/09/1956, 65 y.o., female Today's Date: 06/26/2022   REFERRING PROVIDER:   Jake Coffman PA-C.     PT End of Session - 06/26/22 0903     Visit Number 14    Number of Visits 16    Date for PT Re-Evaluation 07/05/22    Authorization Type FOTO.    PT Start Time 0900    PT Stop Time 1001    PT Time Calculation (min) 61 min    Activity Tolerance Patient tolerated treatment well    Behavior During Therapy [facility] for tasks assessed/performed              Past Medical History:  Diagnosis Date   Cancer ([facility]) 02/2019   Uterine   Hyperlipidemia    Hypertension    History reviewed. No pertinent surgical history. Patient Active Problem List   Diagnosis Date Noted   Hypertension 08/11/2013   Hyperlipidemia 08/11/2013    REFERRING DIAG: DDD, Lumbar.   THERAPY DIAG:  Other low back pain  Abnormal posture  Rationale for Evaluation and Treatment Rehabilitation  PERTINENT HISTORY: HTN, CA (resolved), osteopenia, neuropathy ("hands and feet").   PRECAUTIONS: NONE  SUBJECTIVE: Pt reports that her back feels good today.  She has been performing HEP without issue.   PAIN:  Are you having pain? No: NPRS scale: 0/10      TODAY'S TREATMENT:  8/9 EXERCISE LOG  Exercise Repetitions and Resistance Comments  Nustep  L4 x 16 minutes   Hip Flexion Bil; 20 rep each   HipAbduction  Bil; 20 rep each   Hip Extension Bil; 20 rep each   Rockerboard 4 min   LAQ 3# 20 reps; bil   Seated Marches 3# 20 reps; bil   Seated hip abduction Green tband x 2 mins   Ball Squeezes 2 mins   Ham Curl Green tband 20 reps each            Blank cell = exercise not performed today   Modalities  Date:  Unattended Estim: Lumbar, 80-150 Hz, 15 mins, Pain and Tone Hot Pack: Lumbar, 15 mins, Pain and Tone      8/7 EXERCISE LOG  Exercise Repetitions and Resistance Comments  Nustep  L4 x 16  minutes   LAQ 3# 20 reps; bil   Seated Marches 3# 20 reps; bil   Seated hip abduction Red tband x 2 mins   Ball Squeezes 2 mins   Ham Curl Red tband 20 reps each   Single knee to chest 5 reps x 20 sec hold Bil   Lumbar Trunk Rotation 5 reps x 20 sec hold Bil   Bridges 15 reps with 3 sec hold   SLR 10 reps Bil   Rows    Extension    Horizontal Abduction     Blank cell = exercise not performed today   Modalities  Date:  Unattended Estim: Lumbar, 80-150 Hz, 15 mins, Pain and Tone Hot Pack: Lumbar, 15 mins, Pain and Tone        PT Long Term Goals - 06/06/22 1117      PT LONG TERM GOAL #1   Title Independent with a HEP.    Time 6    Period Weeks    Status Partially met     PT LONG TERM GOAL #2   Title Stand 20 minutes with pain not > 3/10.    Time 6      Period Weeks    Status Ongoing (06/20/22: can stand approx. 10 mins)     PT LONG TERM GOAL #3   Title Perform ADL's with pain not > 3-4/10.    Time 6    Period Weeks    Status Partially Met (06/20/22: cleaning can exacerbate pain)             Plan - 06/06/22 1117    Clinical Impression Statement Pt arrives for today's treatment session denying any pain.  Pt introduced to standing hip exercises today.  Pt reported that pain increased in leg she was standing on versus the leg she was performing exercises.  Pt able to perform all reps asked of her.  Normal responses to estim and MH noted upon removal.  Pt denied any pain at completion of today's treatment session.    Personal Factors and Comorbidities Comorbidity 1;Other    Comorbidities HTN, CA (resolved), osteopenia, neuropathy ("hands and feet").    Examination-Activity Limitations Other;Locomotion Level    Examination-Participation Restrictions Other;Meal Prep;Yard Work    Stability/Clinical Decision Making Stable/Uncomplicated    Rehab Potential Good    PT Frequency 2x / week    PT Duration 6 weeks    PT Treatment/Interventions ADLs/Self Care Home  Management;Cryotherapy;Electrical Stimulation;Ultrasound;Moist Heat;Functional mobility training;Therapeutic activities;Therapeutic exercise;Patient/family education;Manual techniques;Passive range of motion;Dry needling    PT Next Visit Plan Combo e'stim/US, STW/M, core exercise progression. FOTO at 11th visit.   Consulted and Agree with Plan of Care Patient                  Jennifer A Rogers, PTA 06/26/2022, 10:04 AM     

## 2022-07-01 ENCOUNTER — Ambulatory Visit: Payer: BC Managed Care – PPO

## 2022-07-01 DIAGNOSIS — M5459 Other low back pain: Secondary | ICD-10-CM

## 2022-07-01 DIAGNOSIS — R293 Abnormal posture: Secondary | ICD-10-CM

## 2022-07-01 NOTE — Therapy (Signed)
OUTPATIENT PHYSICAL THERAPY TREATMENT NOTE   Patient Name: Krista Duncan MRN: [id number] DOB:[birth year], 66 y.o., female Today's Date: 07/01/2022   REFERRING PROVIDER:   Eulis Canner PA-C.     PT End of Session - 07/01/22 0901     Visit Number 15    Number of Visits 16    Date for PT Re-Evaluation 07/05/22    Authorization Type FOTO.    PT Start Time 0900    PT Stop Time 0959    PT Time Calculation (min) 59 min    Activity Tolerance Patient tolerated treatment well    Behavior During Therapy [facility] for tasks assessed/performed              Past Medical History:  Diagnosis Date   Cancer ([place]) 02/2019   Uterine   Hyperlipidemia    Hypertension    History reviewed. No pertinent surgical history. Patient Active Problem List   Diagnosis Date Noted   Hypertension 08/11/2013   Hyperlipidemia 08/11/2013    REFERRING DIAG: DDD, Lumbar.   THERAPY DIAG:  Other low back pain  Abnormal posture  Rationale for Evaluation and Treatment Rehabilitation  PERTINENT HISTORY: HTN, CA (resolved), osteopenia, neuropathy ("hands and feet").   PRECAUTIONS: NONE  SUBJECTIVE: Pt reports performing stretches this morning for her back and is feeling pretty good.  PAIN:  Are you having pain? No: NPRS scale: 0/10      TODAY'S TREATMENT:  8/14 EXERCISE LOG  Exercise Repetitions and Resistance Comments  Nustep  L4 x 16 minutes   Hip Flexion Bil; 2# 20 rep each   Hip Abduction  Bil; 2# 20 rep each   Hip Extension Bil; 2# 20 rep each   Rockerboard 5 min   LAQ 4# 20 reps; bil   Seated Marches 4# 20 reps; bil   Seated hip abduction Green tband x 2 mins   Ball Squeezes 2 mins   Ham Curl Green tband 25 reps each   STS 15 reps        Blank cell = exercise not performed today   Modalities  Date:  Unattended Estim: Lumbar, 80-150 Hz, 15 mins, Pain and Tone Hot Pack: Lumbar, 15 mins, Pain and Tone     PT Long Term Goals - 06/06/22 1117      PT LONG TERM GOAL #1    Title Independent with a HEP.    Time 6    Period Weeks    Status Partially met     PT LONG TERM GOAL #2   Title Stand 20 minutes with pain not > 3/10.    Time 6    Period Weeks    Status Ongoing (06/20/22: can stand approx. 10 mins)     PT LONG TERM GOAL #3   Title Perform ADL's with pain not > 3-4/10.    Time 6    Period Weeks    Status Partially Met (06/20/22: cleaning can exacerbate pain)             Plan - 06/06/22 1117    Clinical Impression Statement Pt arrives for today's treatment session denying any pain.  Pt reports performing stretches at home this morning and thinks they are helping with her back pain. Pt able to tolerate increased resistance with standing exercises today.  Pt able to tolerate increased reps and resistance with some seated exercises today, but is limited by hip pain with seated marching.  Normal responses to estim and MH noted upon removal.  Pt denied  any pain upon completion of today's treatment session.    Personal Factors and Comorbidities Comorbidity 1;Other    Comorbidities HTN, CA (resolved), osteopenia, neuropathy ("hands and feet").    Examination-Activity Limitations Other;Locomotion Level    Examination-Participation Restrictions Other;Meal Prep;Yard Work    Stability/Clinical Decision Making Stable/Uncomplicated    Rehab Potential Good    PT Frequency 2x / week    PT Duration 6 weeks    PT Treatment/Interventions ADLs/Self Care Home Management;Cryotherapy;Electrical Stimulation;Ultrasound;Moist Heat;Functional mobility training;Therapeutic activities;Therapeutic exercise;Patient/family education;Manual techniques;Passive range of motion;Dry needling    PT Next Visit Plan Combo e'stim/US, STW/M, core exercise progression. FOTO at 11th visit.   Consulted and Agree with Plan of Care Patient                  Kathrynn Ducking, PTA 07/01/2022, 10:01 AM

## 2022-07-03 ENCOUNTER — Ambulatory Visit: Payer: BC Managed Care – PPO

## 2022-07-03 DIAGNOSIS — R293 Abnormal posture: Secondary | ICD-10-CM

## 2022-07-03 DIAGNOSIS — M5459 Other low back pain: Secondary | ICD-10-CM | POA: Diagnosis not present

## 2022-07-03 NOTE — Therapy (Addendum)
OUTPATIENT PHYSICAL THERAPY TREATMENT NOTE   Patient Name: Krista Duncan MRN: [id number] DOB:02-11-1956, 66 y.o., female Today's Date: 07/03/2022   REFERRING PROVIDER:   Eulis Canner PA-C.     PT End of Session - 07/03/22 0903     Visit Number 16    Number of Visits 16    Date for PT Re-Evaluation 07/05/22    Authorization Type FOTO.    PT Start Time 0900    PT Stop Time 0959    PT Time Calculation (min) 59 min    Activity Tolerance Patient tolerated treatment well    Behavior During Therapy [facility] for tasks assessed/performed              Past Medical History:  Diagnosis Date   Cancer ([place]) 02/2019   Uterine   Hyperlipidemia    Hypertension    History reviewed. No pertinent surgical history. Patient Active Problem List   Diagnosis Date Noted   Hypertension 08/11/2013   Hyperlipidemia 08/11/2013    REFERRING DIAG: DDD, Lumbar.   THERAPY DIAG:  Other low back pain  Abnormal posture  Rationale for Evaluation and Treatment Rehabilitation  PERTINENT HISTORY: HTN, CA (resolved), osteopenia, neuropathy ("hands and feet").   PRECAUTIONS: NONE  SUBJECTIVE: Pt reports feeling good this morning.  Pt reports she heard from her doctor and she will be getting spinal injections on 8/28.    PAIN:  Are you having pain? No: NPRS scale: 0/10      TODAY'S TREATMENT:  8/14 EXERCISE LOG  Exercise Repetitions and Resistance Comments  Nustep  L4 x 17 minutes   Hip Flexion Bil; 2# 20 rep each   Hip Abduction  Bil; 2# 20 rep each   Hip Extension Bil; 2# 20 rep each   Rockerboard 5 min   LAQ 4# 20 reps; bil   Seated Marches 4# 20 reps; bil   Seated hip abduction Green tband x 2 mins   Ball Squeezes 2 mins   Ham Curl Green tband 25 reps each   STS 15 reps        Blank cell = exercise not performed today   Modalities  Date:  Unattended Estim: Lumbar, 80-150 Hz, 15 mins, Pain and Tone Hot Pack: Lumbar, 15 mins, Pain and Tone     PT Long Term Goals -  06/06/22 1117      PT LONG TERM GOAL #1   Title Independent with a HEP.    Time 6    Period Weeks    Status Achieved     PT LONG TERM GOAL #2   Title Stand 20 minutes with pain not > 3/10.    Time 6    Period Weeks    Status Ongoing (06/20/22: can stand approx. 10 mins)     PT LONG TERM GOAL #3   Title Perform ADL's with pain not > 3-4/10.    Time 6    Period Weeks    Status Partially Met (07/03/22: depends on day and situation)             Plan - 06/06/22 1117    Clinical Impression Statement Pt arrives for today's treatment session denying any pain.  Pt reports that she has an appointment to get her spinal injections on 8/28.  Pt has made progress towards all of her goals at this, but is still limited by her pain with standing and ADLs.  Pt given green tband to progress HEP as needed on her own.  Pt  encouraged to call the facility with any questions or concerns.  Pt ready for discharge at this time.     Personal Factors and Comorbidities Comorbidity 1;Other    Comorbidities HTN, CA (resolved), osteopenia, neuropathy ("hands and feet").    Examination-Activity Limitations Other;Locomotion Level    Examination-Participation Restrictions Other;Meal Prep;Yard Work    Stability/Clinical Decision Making Stable/Uncomplicated    Rehab Potential Good    PT Frequency 2x / week    PT Duration 6 weeks    PT Treatment/Interventions ADLs/Self Care Home Management;Cryotherapy;Electrical Stimulation;Ultrasound;Moist Heat;Functional mobility training;Therapeutic activities;Therapeutic exercise;Patient/family education;Manual techniques;Passive range of motion;Dry needling    PT Next Visit Plan Combo e'stim/US, STW/M, core exercise progression. FOTO at 11th visit.   Consulted and Agree with Plan of Care Patient                  Kathrynn Ducking, PTA 07/03/2022, 10:01 AM    PHYSICAL THERAPY DISCHARGE SUMMARY  Visits from Start of Care: 16.  Current functional level related to  goals / functional outcomes: See above.   Remaining deficits: See goal section.   Education / Equipment: HEP.   Patient agrees to discharge. Patient goals were partially met. Patient is being discharged due to being pleased with the current functional level.    Mali Applegate MPT

## 2022-09-23 ENCOUNTER — Other Ambulatory Visit: Payer: Self-pay | Admitting: Family Medicine

## 2022-09-23 DIAGNOSIS — Z1231 Encounter for screening mammogram for malignant neoplasm of breast: Secondary | ICD-10-CM

## 2022-11-27 ENCOUNTER — Ambulatory Visit
Admission: RE | Admit: 2022-11-27 | Discharge: 2022-11-27 | Disposition: A | Discharge status: Home / Self Care | Payer: BC Managed Care – PPO | Source: Ambulatory Visit | Attending: Family Medicine | Admitting: Family Medicine

## 2022-11-27 DIAGNOSIS — Z1231 Encounter for screening mammogram for malignant neoplasm of breast: Secondary | ICD-10-CM

## 2023-11-03 ENCOUNTER — Other Ambulatory Visit: Payer: Self-pay | Admitting: Family Medicine

## 2023-11-03 DIAGNOSIS — Z1231 Encounter for screening mammogram for malignant neoplasm of breast: Secondary | ICD-10-CM

## 2023-12-05 ENCOUNTER — Ambulatory Visit
Admission: RE | Admit: 2023-12-05 | Discharge: 2023-12-05 | Disposition: A | Discharge status: Home / Self Care | Payer: Medicare PPO | Source: Ambulatory Visit | Attending: Family Medicine | Admitting: Family Medicine

## 2023-12-05 DIAGNOSIS — Z1231 Encounter for screening mammogram for malignant neoplasm of breast: Secondary | ICD-10-CM

## 2023-12-20 IMAGING — MG MM DIGITAL SCREENING BILAT W/ TOMO AND CAD
8 series · 8 of 24 positions shown · non-contrast
Comparison: Previous exam(s).

CLINICAL DATA: Screening.

EXAM:
DIGITAL SCREENING BILATERAL MAMMOGRAM WITH TOMOSYNTHESIS AND CAD
TECHNIQUE: Bilateral screening digital craniocaudal and mediolateral oblique
mammograms were obtained. Bilateral screening digital breast
tomosynthesis was performed. The images were evaluated with
computer-aided detection.

[L MLO synth-2D]
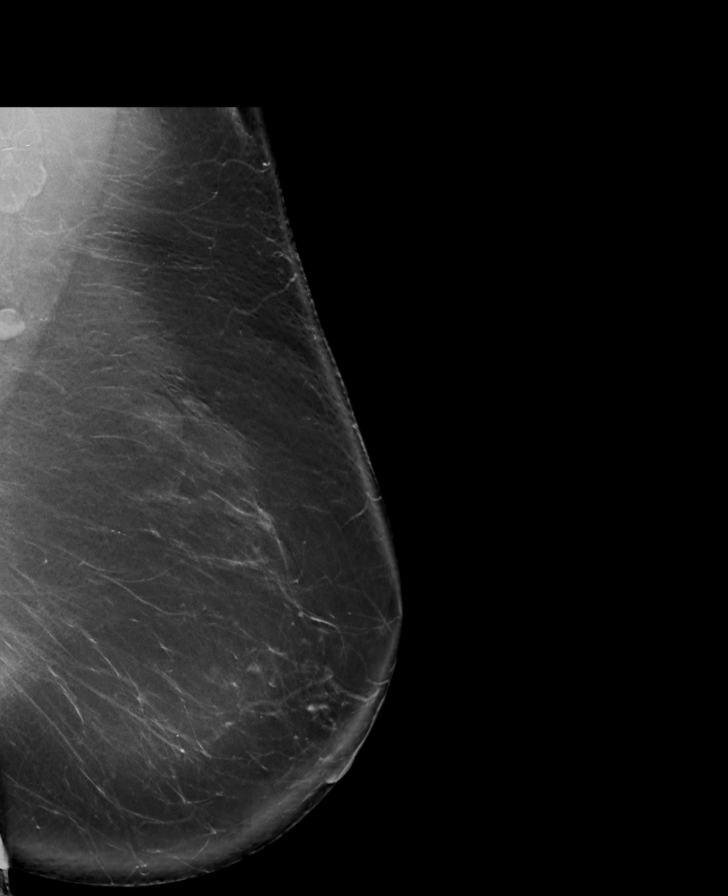

[R MLO synth-2D]
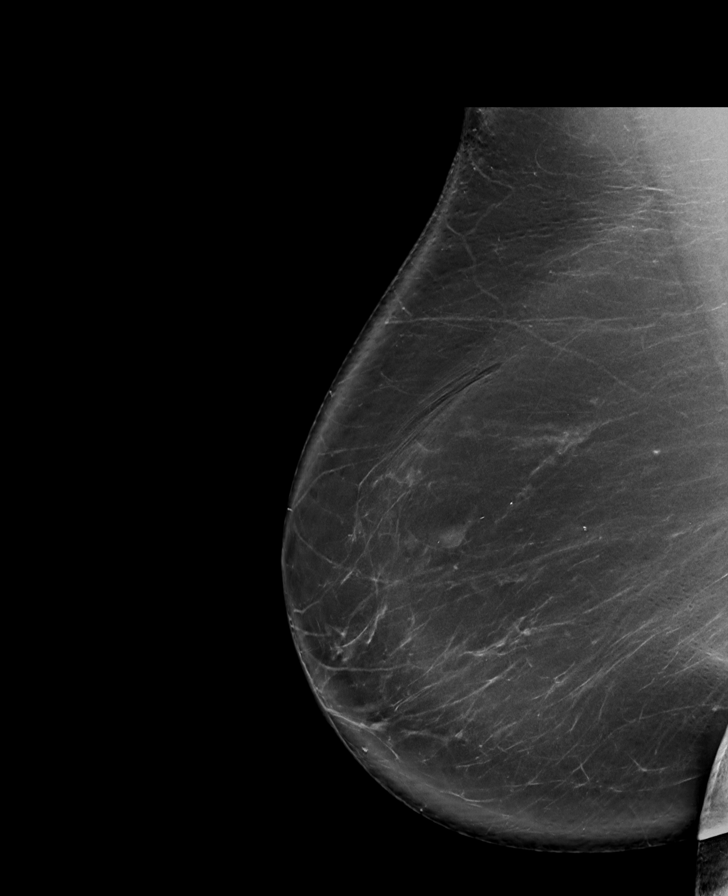

[L CC synth-2D]
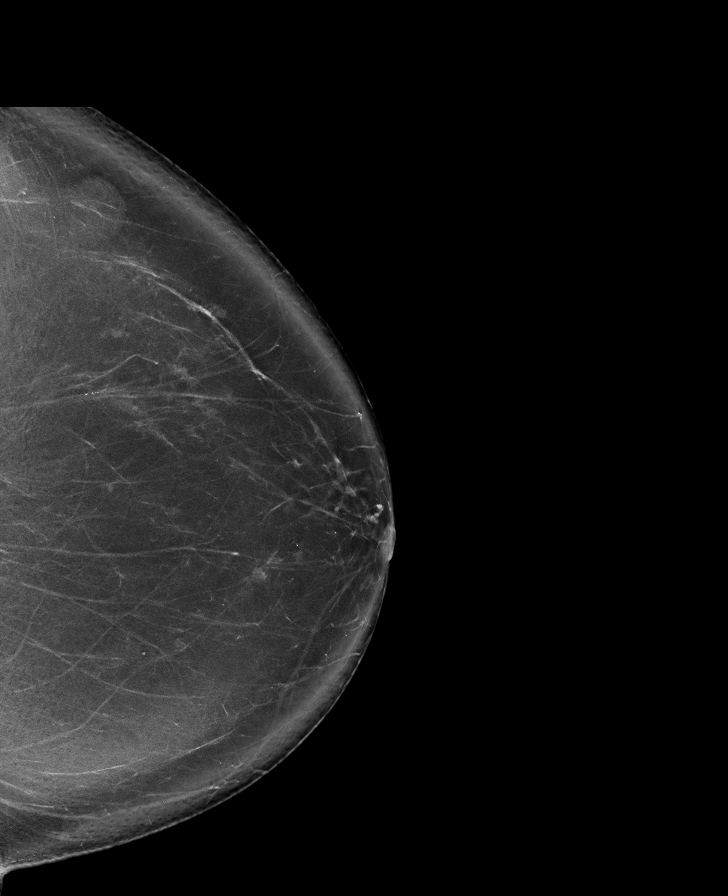

[R CC synth-2D]
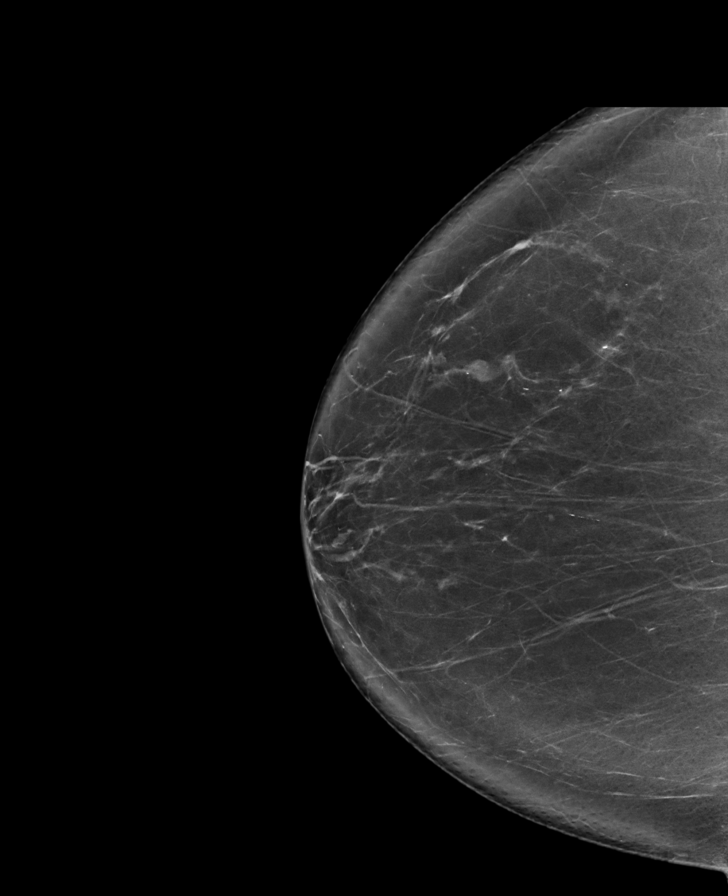

[R MLO tomo · tomo slice 54/107.0]
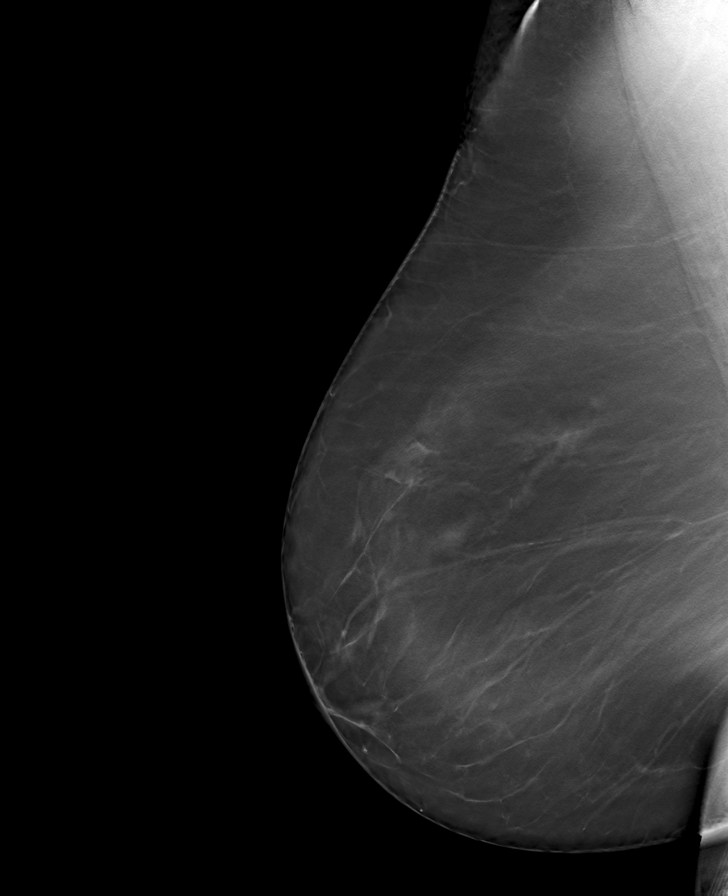

[L CC tomo · tomo slice 50/99.0]
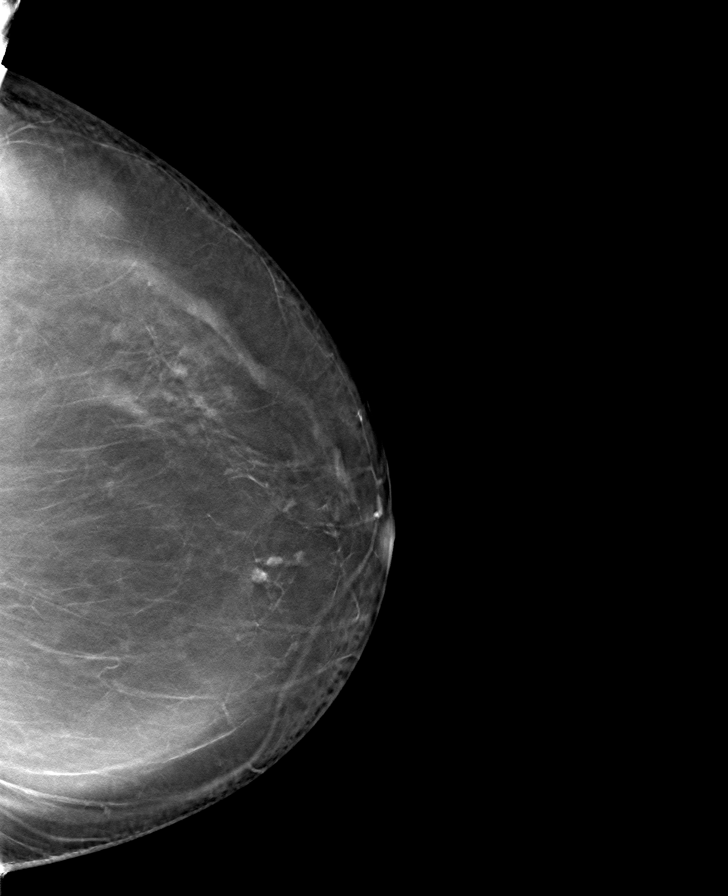

[L MLO tomo · tomo slice 57/112.0]
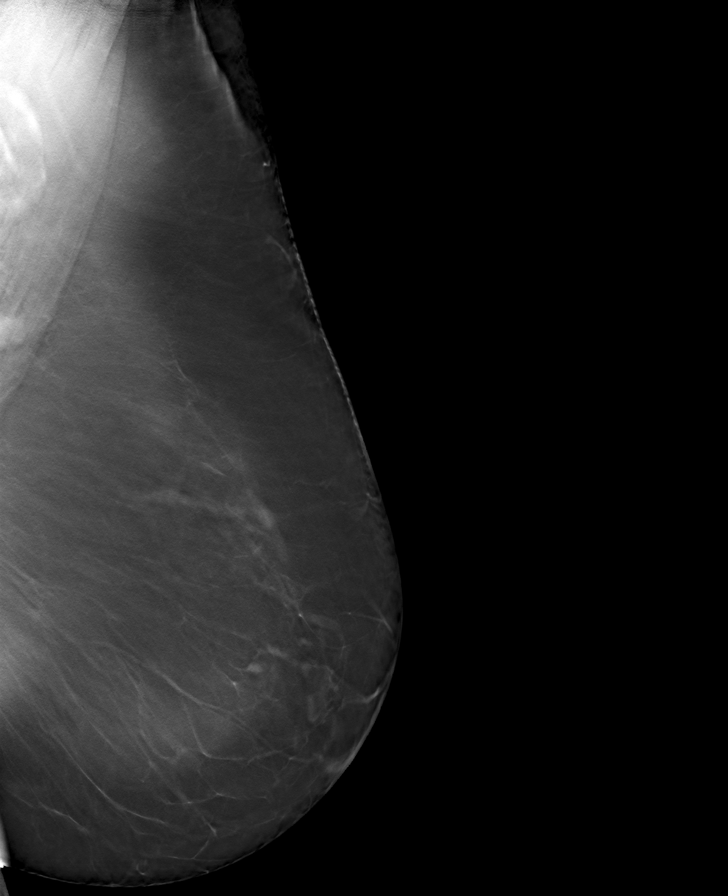

[R CC tomo · tomo slice 45/90.0]
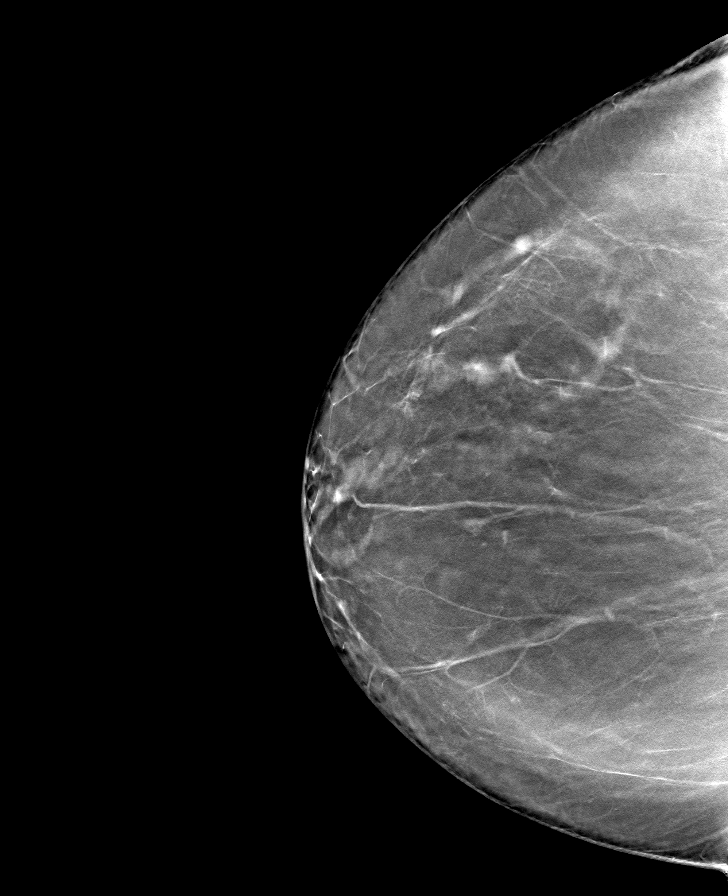

[8 of 24 positions shown; findings below may reference images not displayed]

ACR Breast Density Category b: There are scattered areas of
fibroglandular density.
FINDINGS: There are no findings suspicious for malignancy.
IMPRESSION: No mammographic evidence of malignancy. A result letter of this
screening mammogram will be mailed directly to the patient.

RECOMMENDATION:
Screening mammogram in one year. (Code:51-O-LD2)

BI-RADS CATEGORY  1: Negative.

## 2024-11-22 ENCOUNTER — Other Ambulatory Visit: Payer: Self-pay | Admitting: Family Medicine

## 2024-11-22 DIAGNOSIS — Z1231 Encounter for screening mammogram for malignant neoplasm of breast: Secondary | ICD-10-CM

## 2024-12-17 ENCOUNTER — Ambulatory Visit
Admission: RE | Admit: 2024-12-17 | Discharge: 2024-12-17 | Disposition: A | Source: Ambulatory Visit | Attending: Family Medicine | Admitting: Family Medicine

## 2024-12-17 DIAGNOSIS — Z1231 Encounter for screening mammogram for malignant neoplasm of breast: Secondary | ICD-10-CM
